# Patient Record
Sex: Female | Born: 1965 | Race: White | Hispanic: No | Marital: Married | Smoking: Never smoker
Health system: Southern US, Community
[De-identification: ages and names within clinical notes are randomized; demographics above are authoritative.]

## PROBLEM LIST (undated history)

## (undated) DIAGNOSIS — R946 Abnormal results of thyroid function studies: Secondary | ICD-10-CM

## (undated) DIAGNOSIS — G96191 Perineural cyst: Secondary | ICD-10-CM

## (undated) DIAGNOSIS — K219 Gastro-esophageal reflux disease without esophagitis: Secondary | ICD-10-CM

## (undated) DIAGNOSIS — G548 Other nerve root and plexus disorders: Secondary | ICD-10-CM

## (undated) DIAGNOSIS — D259 Leiomyoma of uterus, unspecified: Secondary | ICD-10-CM

## (undated) DIAGNOSIS — G588 Other specified mononeuropathies: Secondary | ICD-10-CM

## (undated) DIAGNOSIS — N9489 Other specified conditions associated with female genital organs and menstrual cycle: Secondary | ICD-10-CM

## (undated) DIAGNOSIS — M719 Bursopathy, unspecified: Secondary | ICD-10-CM

## (undated) DIAGNOSIS — M67919 Unspecified disorder of synovium and tendon, unspecified shoulder: Secondary | ICD-10-CM

## (undated) DIAGNOSIS — R292 Abnormal reflex: Secondary | ICD-10-CM

## (undated) DIAGNOSIS — N189 Chronic kidney disease, unspecified: Secondary | ICD-10-CM

## (undated) DIAGNOSIS — G8929 Other chronic pain: Secondary | ICD-10-CM

## (undated) DIAGNOSIS — K2970 Gastritis, unspecified, without bleeding: Secondary | ICD-10-CM

## (undated) DIAGNOSIS — M542 Cervicalgia: Secondary | ICD-10-CM

## (undated) DIAGNOSIS — R102 Pelvic and perineal pain unspecified side: Secondary | ICD-10-CM

## (undated) DIAGNOSIS — L570 Actinic keratosis: Secondary | ICD-10-CM

## (undated) DIAGNOSIS — D219 Benign neoplasm of connective and other soft tissue, unspecified: Secondary | ICD-10-CM

## (undated) HISTORY — DX: Gastritis, unspecified, without bleeding: K29.70

## (undated) HISTORY — DX: Other specified conditions associated with female genital organs and menstrual cycle: N94.89

## (undated) HISTORY — DX: Actinic keratosis: L57.0

## (undated) HISTORY — DX: Pelvic and perineal pain unspecified side: R10.20

## (undated) HISTORY — DX: Chronic kidney disease, unspecified: N18.9

## (undated) HISTORY — DX: Perineural cyst: G96.191

## (undated) HISTORY — DX: Benign neoplasm of connective and other soft tissue, unspecified: D21.9

## (undated) HISTORY — DX: Abnormal reflex: R29.2

## (undated) HISTORY — DX: Other specified mononeuropathies: G58.8

## (undated) HISTORY — DX: Pelvic and perineal pain: R10.2

## (undated) HISTORY — DX: Other chronic pain: G89.29

## (undated) HISTORY — DX: Abnormal results of thyroid function studies: R94.6

## (undated) HISTORY — DX: Cervicalgia: M54.2

## (undated) HISTORY — PX: APPENDECTOMY: SHX54

## (undated) HISTORY — DX: Gastro-esophageal reflux disease without esophagitis: K21.9

## (undated) HISTORY — DX: Other nerve root and plexus disorders: G54.8

---
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
## 2001-02-24 HISTORY — PX: NISSEN FUNDOPLICATION: SHX2091

## 2008-04-24 ENCOUNTER — Ambulatory Visit: Payer: Self-pay | Admitting: Sports Medicine

## 2009-01-03 DIAGNOSIS — D229 Melanocytic nevi, unspecified: Secondary | ICD-10-CM

## 2009-01-03 HISTORY — DX: Melanocytic nevi, unspecified: D22.9

## 2010-02-24 HISTORY — PX: TOTAL ABDOMINAL HYSTERECTOMY: SHX209

## 2010-02-24 HISTORY — PX: ABDOMINAL HYSTERECTOMY: SHX81

## 2010-05-08 ENCOUNTER — Ambulatory Visit: Payer: Self-pay | Admitting: Family Medicine

## 2010-08-06 ENCOUNTER — Ambulatory Visit: Payer: Self-pay | Admitting: Obstetrics and Gynecology

## 2010-08-12 ENCOUNTER — Ambulatory Visit: Payer: Self-pay | Admitting: Obstetrics and Gynecology

## 2013-01-19 ENCOUNTER — Ambulatory Visit: Payer: Self-pay | Admitting: Family Medicine

## 2013-02-15 ENCOUNTER — Ambulatory Visit: Payer: Self-pay | Admitting: Family Medicine

## 2013-06-08 ENCOUNTER — Ambulatory Visit: Payer: Self-pay | Admitting: Family Medicine

## 2013-08-23 ENCOUNTER — Ambulatory Visit: Payer: Self-pay | Admitting: Family Medicine

## 2013-09-29 ENCOUNTER — Ambulatory Visit: Payer: Self-pay | Admitting: Family Medicine

## 2013-10-14 ENCOUNTER — Ambulatory Visit: Payer: Self-pay | Admitting: Neurology

## 2014-06-12 ENCOUNTER — Ambulatory Visit: Payer: Self-pay | Admitting: Podiatry

## 2014-06-14 DIAGNOSIS — G588 Other specified mononeuropathies: Secondary | ICD-10-CM | POA: Insufficient documentation

## 2014-06-14 DIAGNOSIS — N9489 Other specified conditions associated with female genital organs and menstrual cycle: Secondary | ICD-10-CM | POA: Insufficient documentation

## 2014-06-14 DIAGNOSIS — K219 Gastro-esophageal reflux disease without esophagitis: Secondary | ICD-10-CM | POA: Insufficient documentation

## 2014-06-14 DIAGNOSIS — N189 Chronic kidney disease, unspecified: Secondary | ICD-10-CM | POA: Insufficient documentation

## 2014-07-05 ENCOUNTER — Ambulatory Visit: Payer: Self-pay | Admitting: Podiatry

## 2014-07-12 ENCOUNTER — Ambulatory Visit: Payer: Self-pay | Admitting: Podiatry

## 2014-07-19 ENCOUNTER — Other Ambulatory Visit: Payer: Self-pay | Admitting: Family Medicine

## 2014-07-19 ENCOUNTER — Ambulatory Visit
Admission: RE | Admit: 2014-07-19 | Discharge: 2014-07-19 | Disposition: A | Discharge status: Home / Self Care | Payer: BC Managed Care – PPO | Source: Ambulatory Visit | Attending: Family Medicine | Admitting: Family Medicine

## 2014-07-19 DIAGNOSIS — M79644 Pain in right finger(s): Secondary | ICD-10-CM

## 2014-07-19 DIAGNOSIS — S62666A Nondisplaced fracture of distal phalanx of right little finger, initial encounter for closed fracture: Secondary | ICD-10-CM | POA: Diagnosis not present

## 2014-07-19 DIAGNOSIS — W19XXXA Unspecified fall, initial encounter: Secondary | ICD-10-CM | POA: Insufficient documentation

## 2014-08-04 ENCOUNTER — Ambulatory Visit: Payer: Self-pay | Admitting: Family Medicine

## 2014-08-07 ENCOUNTER — Ambulatory Visit (INDEPENDENT_AMBULATORY_CARE_PROVIDER_SITE_OTHER): Payer: BC Managed Care – PPO | Admitting: Family Medicine

## 2014-08-07 ENCOUNTER — Encounter: Payer: Self-pay | Admitting: Family Medicine

## 2014-08-07 VITALS — BP 111/76 | HR 63 | Temp 97.6°F | Ht 63.3 in | Wt 136.4 lb

## 2014-08-07 DIAGNOSIS — M999 Biomechanical lesion, unspecified: Secondary | ICD-10-CM

## 2014-08-07 DIAGNOSIS — M9902 Segmental and somatic dysfunction of thoracic region: Secondary | ICD-10-CM | POA: Diagnosis not present

## 2014-08-07 DIAGNOSIS — M9903 Segmental and somatic dysfunction of lumbar region: Secondary | ICD-10-CM | POA: Diagnosis not present

## 2014-08-07 DIAGNOSIS — M9905 Segmental and somatic dysfunction of pelvic region: Secondary | ICD-10-CM | POA: Diagnosis not present

## 2014-08-07 DIAGNOSIS — M9904 Segmental and somatic dysfunction of sacral region: Secondary | ICD-10-CM | POA: Diagnosis not present

## 2014-08-07 DIAGNOSIS — M9909 Segmental and somatic dysfunction of abdomen and other regions: Secondary | ICD-10-CM | POA: Diagnosis not present

## 2014-08-07 DIAGNOSIS — N9489 Other specified conditions associated with female genital organs and menstrual cycle: Secondary | ICD-10-CM

## 2014-08-07 DIAGNOSIS — M489 Spondylopathy, unspecified: Secondary | ICD-10-CM

## 2014-08-07 DIAGNOSIS — M9906 Segmental and somatic dysfunction of lower extremity: Secondary | ICD-10-CM | POA: Diagnosis not present

## 2014-08-07 NOTE — Assessment & Plan Note (Signed)
Stable, seems to be in a bit of an exacerbation at this time. Her pain has been chronic with the patient for 18+ months, although better over the past 8 months. Did better with PT, but it was in [facility] which made it difficult to get there. Complains of neuropathic pain, could not tolerate neuroleptics. 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 Today, her restrictions seem to be doing a bit better on the L, but her psoas is hypertonic on the R causing increased issues. She did well with her last OMT treatment and had not had a flare in 2 months. She is interested in seeing how OMT helps her flare today. She is interested in continuing OMT to see if it might help. She does seem to have some functional and myofascial issues that may benefit from OMT. Patient treated today, as discussed below. Will continue to monitor and she will return in 1-2 weeks for recheck.

## 2014-08-07 NOTE — Patient Instructions (Signed)
Back Injury Prevention The following tips can help you to prevent a back injury. PHYSICAL FITNESS  Exercise often. Try to develop strong stomach (abdominal) muscles.  Do aerobic exercises often. This includes walking, jogging, biking, swimming.  Do exercises that help with balance and strength often. This includes tai chi and yoga.  Stretch before and after you exercise.  Keep a healthy weight. DIET   Ask your doctor how much calcium and vitamin D you need every day.  Include calcium in your diet. Foods high in calcium include dairy products; green, leafy vegetables; and products with calcium added (fortified).  Include vitamin D in your diet. Foods high in vitamin D include milk and products with vitamin D added.  Think about taking a multivitamin or other nutritional products called " supplements."  Stop smoking if you smoke. POSTURE   Sit and stand up straight. Avoid leaning forward or hunching over.  Choose chairs that support your lower back.  If you work at a desk:  Sit close to your work so you do not lean over.  Keep your chin tucked in.  Keep your neck drawn back.  Keep your elbows bent at a right angle. Your arms should look like the letter "L."  Sit high and close to the steering wheel when you drive. Add low back support to your car seat if needed.  Avoid sitting or standing in one position for too long. Get up and move around every hour. Take breaks if you are driving for a long time.  Sleep on your side with your knees slightly bent. You can also sleep on your back with a pillow under your knees. Do not sleep on your stomach. LIFTING, TWISTING, AND REACHING  Avoid heavy lifting, especially lifting over and over again. If you must do heavy lifting:  Stretch before lifting.  Work slowly.  Rest between lifts.  Use carts and dollies to move objects when possible.  Make several small trips instead of carrying 1 heavy load.  Ask for help when you  need it.  Ask for help when moving big, awkward objects.  Follow these steps when lifting:  Stand with your feet shoulder-width apart.  Get as close to the object as you can. Do not pick up heavy objects that are far from your body.  Use handles or lifting straps when possible.  Bend at your knees. Squat down, but keep your heels off the floor.  Keep your shoulders back, your chin tucked in, and your back straight.  Lift the object slowly. Tighten the muscles in your legs, stomach, and butt. Keep the object as close to the center of your body as possible.  Reverse these directions when you put a load down.  Do not:  Lift the object above your waist.  Twist at the waist while lifting or carrying a load. Move your feet if you need to turn, not your waist.  Bend over without bending at your knees.  Avoid reaching over your head, across a table, or for an object on a high surface. OTHER TIPS  Avoid wet floors and keep sidewalks clear of ice.  Do not sleep on a mattress that is too soft or too hard.  Keep items that you use often within easy reach.  Put heavier objects on shelves at waist level. Put lighter objects on lower or higher shelves.  Find ways to lessen your stress. You can try exercise, massage, or relaxation.  Get help for depression or anxiety if  needed. GET HELP IF:  You injure your back.  You have questions about diet, exercise, or other ways to prevent back injuries. MAKE SURE YOU:  Understand these instructions.  Will watch your condition.  Will get help right away if you are not doing well or get worse. Document Released: 07/30/2007 Document Revised: 05/05/2011 Document Reviewed: 03/24/2011 [facility] Patient Information 2015 [place], [place]. This information is not intended to replace advice given to you by your health care provider. Make sure you discuss any questions you have with your health care provider.

## 2014-08-07 NOTE — Progress Notes (Signed)
BP 111/76 mmHg  Pulse 63  Temp(Src) 97.6 F (36.4 C)  Ht 5' 3.3" (1.608 m)  Wt 136 lb 6.4 oz (61.871 kg)  BMI 23.93 kg/m2  SpO2 99%  LMP 07/26/2010 (Approximate)   Subjective:    Patient ID: Sabrina Baxter, female    DOB: [birth year], 48 y.o.   MRN: [id number]  HPI: Sabrina Baxter is a 49 y.o. female  Chief Complaint  Patient presents with  . Back Pain   BACK PAIN- Sabrina Baxter notes that she is in a bit of a flare with her pelvic pain today and has been feeling not like sitting. She has not needed to take her flexeril as she doesn't feel like she has needed it and it has gotten that bad. She feels like OMT has helped and is interested to see what happens today when she is in a flare. She is otherwise feeling well with no other concerns or complaints at this time.  Duration: months Mechanism of injury: unknown Location: low back Onset: sudden Severity: mild Quality: sharp, aching, burning and pulling Frequency: occasional Radiation: none Aggravating factors: movement and prolonged sitting Alleviating factors: rest, heat and muscle relaxer Status: fluctuating Treatments attempted: rest, ice, heat, APAP, ibuprofen, aleve, physical therapy, HEP and OMM  Relief with NSAIDs?: mild Nighttime pain:  no Paresthesias / decreased sensation:  no Bowel / bladder incontinence:  no Fevers:  no Dysuria / urinary frequency:  no  Relevant past medical, surgical, family and social history reviewed and updated as indicated. Interim medical history since our last visit reviewed. Allergies and medications reviewed and updated.  Review of Systems  Constitutional: Negative.   Respiratory: Negative.   Cardiovascular: Negative.   Musculoskeletal: Negative.   Psychiatric/Behavioral: Negative.     Per HPI unless specifically indicated above     Objective:    BP 111/76 mmHg  Pulse 63  Temp(Src) 97.6 F (36.4 C)  Ht 5' 3.3" (1.608 m)  Wt 136 lb 6.4 oz (61.871 kg)  BMI 23.93 kg/m2  SpO2 99%   LMP 07/26/2010 (Approximate)  Wt Readings from Last 3 Encounters:  08/07/14 136 lb 6.4 oz (61.871 kg)  06/12/14 138 lb (62.596 kg)    Physical Exam  Constitutional: She is oriented to person, place, and time. She appears well-developed and well-nourished. No distress.  HENT:  Head: Normocephalic and atraumatic.  Neck: No tracheal deviation present. No thyromegaly present.  Pulmonary/Chest: Effort normal and breath sounds normal.  Abdominal: Soft. Bowel sounds are normal. She exhibits no distension and no mass. There is no tenderness. There is no rebound and no guarding.  Lymphadenopathy:    She has no cervical adenopathy.  Neurological: She is alert and oriented to person, place, and time.  Skin: Skin is warm and dry. She is not diaphoretic.  Psychiatric: She has a normal mood and affect. Her behavior is normal. Judgment and thought content normal.  Nursing note and vitals reviewed. Musculoskeletal:  Exam found Decreased ROM, Tissue texture changes and Tenderness to palpation of patient's  thorax, lumbar, pelvis, sacrum, lower extremity and abdomen Osteopathic Structural Exam:   Thorax: T10-12SRRL, periscapular hypertonicity on the R  Lumbar: psoas hypertonic bilaterally R>L, L3-5SRRL  Pelvis: Anterior L innominate, SI joint restricted bilaterally R>L  Sacrum: R on R torsion, SI joint restricted on the R  Lower Extremity: glut hypertonicity on the R, IT band hypertonicity on the L, internally rotated L femur  Abdomen:  Pelvic diaphragm restricted throughout, L>R with fascial pull into L hip, diaphragm restricted  bilaterally L>R      Assessment & Plan:   Problem List Items Addressed This Visit      Genitourinary   Pelvic pain syndrome - Primary     Stable, seems to be in a bit of an exacerbation at this time. Her pain has been chronic with the patient for 18+ months, although better over the past 8 months. Did better with PT, but it was in [facility] which made it difficult to  get there. Complains of neuropathic pain, could not tolerate neuroleptics. 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 Today, her restrictions seem to be doing a bit better on the L, but her psoas is hypertonic on the R causing increased issues. She did well with her last OMT treatment and had not had a flare in 2 months. She is interested in seeing how OMT helps her flare today. She is interested in continuing OMT to see if it might help. She does seem to have some functional and myofascial issues that may benefit from OMT. Patient treated today, as discussed below. Will continue to monitor and she will return in 1-2 weeks for recheck.        Other Visit Diagnoses    Thoracic segment dysfunction        see below    Lumbar dysfunction        see below    Segmental and somatic dysfunction of sacral region        see below    Segmental dysfunction of pelvic region        see below    Nonallopathic lesion of lower extremities        see below    Segmental dysfunction of abdomen        see below      After verbal consent was obtained, patient was treated today with osteopathic manipulative medicine to the regions of the thorax, lumbar, pelvis, sacrum, abdomen and lower extremity using the techniques of Still, FPR, myofascial release, counterstrain, muscle energy and soft tissue. Areas of compensation relating to her primary pain source also treated. Patient tolerated the procedure well with good objective and good subjective improvement in symptoms. .sex left the room in good condition. He was advised to stay well hydrated and that he may have some soreness following the procedure. If not improving or worsening, he will call and come in. Home exercise program of stretches for psoas discussed and demonstrated today. Patient will do these stretches BID to before the point of pain, and will return for reevaluation  in 1-2 weeks if needed.   Follow up plan: Return As scheduled for  OMM.

## 2014-08-08 ENCOUNTER — Telehealth: Payer: Self-pay

## 2014-08-08 MED ORDER — DICLOFENAC SODIUM 1 % TD GEL: 2.0000 g | Freq: Four times a day (QID) | TRANSDERMAL | Status: DC

## 2014-08-08 NOTE — Telephone Encounter (Signed)
Rx sent to her pharmacy 

## 2014-08-08 NOTE — Telephone Encounter (Signed)
Patient states that when she was in the office yesterday you discussed giving her a cream for her broke finger. She forgot to get that prescription.

## 2014-08-09 ENCOUNTER — Ambulatory Visit (INDEPENDENT_AMBULATORY_CARE_PROVIDER_SITE_OTHER): Payer: BC Managed Care – PPO

## 2014-08-09 ENCOUNTER — Ambulatory Visit (INDEPENDENT_AMBULATORY_CARE_PROVIDER_SITE_OTHER): Payer: BC Managed Care – PPO | Admitting: Podiatry

## 2014-08-09 VITALS — BP 116/77 | HR 77 | Resp 16

## 2014-08-09 DIAGNOSIS — M898X7 Other specified disorders of bone, ankle and foot: Secondary | ICD-10-CM

## 2014-08-09 DIAGNOSIS — M2570 Osteophyte, unspecified joint: Secondary | ICD-10-CM

## 2014-08-09 DIAGNOSIS — M2040 Other hammer toe(s) (acquired), unspecified foot: Secondary | ICD-10-CM

## 2014-08-09 NOTE — Progress Notes (Signed)
   Subjective:    Patient ID: Sabrina Baxter, female    DOB: [birth year], 49 y.o.   MRN: [id number]  HPI My pinky toes hurt me , they have been hurting for years my right one is worse than the left . i walk on the sides of my toes    Review of Systems     Objective:   Physical Exam: I reviewed her past mental history medications allergy surgery social history and review of systems area pulses are strongly palpable bilateral. Neurologic sensorium is intact. Deep tendon reflexes are intact. Mobile left musculoskeletal findings. 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 This has resulted in reactive hyperkeratosis to the lateral nail fold consistent with a Lister score is also associated with a lateral exostosis to the base of the fifth distal phalanx.      Plan: Discussed the etiology pathology conservative versus surgical therapy so we'll follow-up with her in the near future.    Assessment & Plan:  Assessment is adductovarus varus rotated hammertoe deformities bilateral.

## 2014-08-17 ENCOUNTER — Ambulatory Visit (INDEPENDENT_AMBULATORY_CARE_PROVIDER_SITE_OTHER): Payer: BC Managed Care – PPO | Admitting: Family Medicine

## 2014-08-17 ENCOUNTER — Encounter: Payer: Self-pay | Admitting: Family Medicine

## 2014-08-17 VITALS — BP 143/90 | HR 79 | Temp 98.2°F | Ht 63.8 in | Wt 138.8 lb

## 2014-08-17 DIAGNOSIS — N9489 Other specified conditions associated with female genital organs and menstrual cycle: Secondary | ICD-10-CM | POA: Diagnosis not present

## 2014-08-17 DIAGNOSIS — M9904 Segmental and somatic dysfunction of sacral region: Secondary | ICD-10-CM

## 2014-08-17 DIAGNOSIS — M9906 Segmental and somatic dysfunction of lower extremity: Secondary | ICD-10-CM | POA: Diagnosis not present

## 2014-08-17 DIAGNOSIS — M9905 Segmental and somatic dysfunction of pelvic region: Secondary | ICD-10-CM

## 2014-08-17 DIAGNOSIS — M9903 Segmental and somatic dysfunction of lumbar region: Secondary | ICD-10-CM

## 2014-08-17 DIAGNOSIS — M9909 Segmental and somatic dysfunction of abdomen and other regions: Secondary | ICD-10-CM | POA: Diagnosis not present

## 2014-08-17 DIAGNOSIS — M489 Spondylopathy, unspecified: Secondary | ICD-10-CM

## 2014-08-17 DIAGNOSIS — M999 Biomechanical lesion, unspecified: Secondary | ICD-10-CM

## 2014-08-17 NOTE — Assessment & Plan Note (Signed)
In acute exacerbation at this time. Her pain has been chronic for the past 18+ months, although better over the past 8 months. Today is in a flare for an unknown reason. Did better with PT- but getting to [facility] was hard. We will refer to pelvic rehab through [facility] which is closer if still not doing better after her vacation. She complains of neuropathic pain but she cannot tolerate neuroleptics. 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 May benefit from sclerotherapy for scar tissue from GYN procedures. It is not clear if OMT has been helping her and if this is a good mechanism for her. Will treat her today as she is in a flare and see if it is benefitial, but will also look for other treatment modalities to help with her symptoms. Follow up in 2 weeks. She does have some functional and myofascial issues that may benefit from OMT. She was treated today as discussed below.

## 2014-08-17 NOTE — Patient Instructions (Signed)
Back Exercises These exercises may help you when beginning to rehabilitate your injury. Your symptoms may resolve with or without further involvement from your physician, physical therapist or athletic trainer. While completing these exercises, remember:   Restoring tissue flexibility helps normal motion to return to the joints. This allows healthier, less painful movement and activity.  An effective stretch should be held for at least 30 seconds.  A stretch should never be painful. You should only feel a gentle lengthening or release in the stretched tissue. STRETCH - Extension, Prone on Elbows   Lie on your stomach on the floor, a bed will be too soft. Place your palms about shoulder width apart and at the height of your head.  Place your elbows under your shoulders. If this is too painful, stack pillows under your chest.  Allow your body to relax so that your hips drop lower and make contact more completely with the floor.  Hold this position for __________ seconds.  Slowly return to lying flat on the floor. Repeat __________ times. Complete this exercise __________ times per day.  RANGE OF MOTION - Extension, Prone Press Ups   Lie on your stomach on the floor, a bed will be too soft. Place your palms about shoulder width apart and at the height of your head.  Keeping your back as relaxed as possible, slowly straighten your elbows while keeping your hips on the floor. You may adjust the placement of your hands to maximize your comfort. As you gain motion, your hands will come more underneath your shoulders.  Hold this position __________ seconds.  Slowly return to lying flat on the floor. Repeat __________ times. Complete this exercise __________ times per day.  RANGE OF MOTION- Quadruped, Neutral Spine   Assume a hands and knees position on a firm surface. Keep your hands under your shoulders and your knees under your hips. You may place padding under your knees for  comfort.  Drop your head and point your tail bone toward the ground below you. This will round out your low back like an angry cat. Hold this position for __________ seconds.  Slowly lift your head and release your tail bone so that your back sags into a large arch, like an old horse.  Hold this position for __________ seconds.  Repeat this until you feel limber in your low back.  Now, find your "sweet spot." This will be the most comfortable position somewhere between the two previous positions. This is your neutral spine. Once you have found this position, tense your stomach muscles to support your low back.  Hold this position for __________ seconds. Repeat __________ times. Complete this exercise __________ times per day.  STRETCH - Flexion, Single Knee to Chest   Lie on a firm bed or floor with both legs extended in front of you.  Keeping one leg in contact with the floor, bring your opposite knee to your chest. Hold your leg in place by either grabbing behind your thigh or at your knee.  Pull until you feel a gentle stretch in your low back. Hold __________ seconds.  Slowly release your grasp and repeat the exercise with the opposite side. Repeat __________ times. Complete this exercise __________ times per day.  STRETCH - Hamstrings, Standing  Stand or sit and extend your right / left leg, placing your foot on a chair or foot stool  Keeping a slight arch in your low back and your hips straight forward.  Lead with your chest and   lean forward at the waist until you feel a gentle stretch in the back of your right / left knee or thigh. (When done correctly, this exercise requires leaning only a small distance.)  Hold this position for __________ seconds. Repeat __________ times. Complete this stretch __________ times per day. STRENGTHENING - Deep Abdominals, Pelvic Tilt   Lie on a firm bed or floor. Keeping your legs in front of you, bend your knees so they are both pointed  toward the ceiling and your feet are flat on the floor.  Tense your lower abdominal muscles to press your low back into the floor. This motion will rotate your pelvis so that your tail bone is scooping upwards rather than pointing at your feet or into the floor.  With a gentle tension and even breathing, hold this position for __________ seconds. Repeat __________ times. Complete this exercise __________ times per day.  STRENGTHENING - Abdominals, Crunches   Lie on a firm bed or floor. Keeping your legs in front of you, bend your knees so they are both pointed toward the ceiling and your feet are flat on the floor. Cross your arms over your chest.  Slightly tip your chin down without bending your neck.  Tense your abdominals and slowly lift your trunk high enough to just clear your shoulder blades. Lifting higher can put excessive stress on the low back and does not further strengthen your abdominal muscles.  Control your return to the starting position. Repeat __________ times. Complete this exercise __________ times per day.  STRENGTHENING - Quadruped, Opposite UE/LE Lift   Assume a hands and knees position on a firm surface. Keep your hands under your shoulders and your knees under your hips. You may place padding under your knees for comfort.  Find your neutral spine and gently tense your abdominal muscles so that you can maintain this position. Your shoulders and hips should form a rectangle that is parallel with the floor and is not twisted.  Keeping your trunk steady, lift your right hand no higher than your shoulder and then your left leg no higher than your hip. Make sure you are not holding your breath. Hold this position __________ seconds.  Continuing to keep your abdominal muscles tense and your back steady, slowly return to your starting position. Repeat with the opposite arm and leg. Repeat __________ times. Complete this exercise __________ times per day. Document Released:  02/28/2005 Document Revised: 05/05/2011 Document Reviewed: 05/25/2008 [employer] Patient Information 2015 [employer]. This information is not intended to replace advice given to you by your health care provider. Make sure you discuss any questions you have with your health care provider.  

## 2014-08-17 NOTE — Progress Notes (Signed)
BP 143/90 mmHg  Pulse 79  Temp(Src) 98.2 F (36.8 C)  Ht 5' 3.8" (1.621 m)  Wt 138 lb 12.8 oz (62.959 kg)  BMI 23.96 kg/m2  SpO2 100%  LMP 07/26/2010 (Approximate)   Subjective:    Patient ID: Sabrina Baxter, female    DOB: [birth year], 49 y.o.   MRN: [id number]  HPI: Sabrina Baxter is a 49 y.o. female who presents today for evaluation and possible treatment for chronic intermittent pelvic pain. She is not doing well today. She is in a flare and has had trouble sitting down. Trying to avoid taking her muscle relaxer. She is still not sure if OMT helped. She was in a flare last time she was here and is not sure if it helped or not. She is otherwise feeling well with no other concerns or complaints at this time. + headache today and notes that her BP is up. Has never noticed that it is before.   Chief Complaint  Patient presents with  . Pelvic Pain   BACK PAIN Duration: chronic Mechanism of injury: unknown Location: low back Onset: sudden Severity: moderate Quality: sharp, dull, aching, burning, cramping, ill-defined, itchy, pressure-like, pulling, shooting, sore, stabbing, tender, tearing and throbbing Frequency: intermittent Radiation: none Aggravating factors: prolonged sitting Alleviating factors: muscle relaxer Status: worse Treatments attempted: rest, ice, heat, APAP, ibuprofen, aleve, physical therapy, HEP and OMM  Relief with NSAIDs?: mild Nighttime pain:  no Paresthesias / decreased sensation:  yes Bowel / bladder incontinence:  no Fevers:  no Dysuria / urinary frequency:  no   Relevant past medical, surgical, family and social history reviewed and updated as indicated. Interim medical history since our last visit reviewed. Allergies and medications reviewed and updated.  Review of Systems  Constitutional: Negative.   Respiratory: Negative.   Cardiovascular: Negative.   Gastrointestinal: Negative.   Musculoskeletal: Negative.   Psychiatric/Behavioral: Negative.      Per HPI unless specifically indicated above     Objective:    BP 143/90 mmHg  Pulse 79  Temp(Src) 98.2 F (36.8 C)  Ht 5' 3.8" (1.621 m)  Wt 138 lb 12.8 oz (62.959 kg)  BMI 23.96 kg/m2  SpO2 100%  LMP 07/26/2010 (Approximate) Repeat BP 140/90 Wt Readings from Last 3 Encounters:  08/17/14 138 lb 12.8 oz (62.959 kg)  08/07/14 136 lb 6.4 oz (61.871 kg)  06/12/14 138 lb (62.596 kg)    Physical Exam  Constitutional: She is oriented to person, place, and time. She appears well-developed and well-nourished. No distress.  HENT:  Head: Normocephalic and atraumatic.  Right Ear: Hearing normal.  Left Ear: Hearing normal.  Nose: Nose normal.  Eyes: Conjunctivae and lids are normal. Right eye exhibits no discharge. Left eye exhibits no discharge. No scleral icterus.  Pulmonary/Chest: Effort normal. No respiratory distress.  Abdominal: Soft. She exhibits no distension and no mass. There is no tenderness. There is no rebound and no guarding.  Neurological: She is alert and oriented to person, place, and time.  Skin: Skin is warm, dry and intact. No rash noted. No erythema. No pallor.  Psychiatric: She has a normal mood and affect. Her speech is normal and behavior is normal. Judgment and thought content normal. Cognition and memory are normal.  Musculoskeletal:  Exam found Decreased ROM, Tissue texture changes and Tenderness to palpation of patient's  lumbar, pelvis, sacrum, lower extremity and abdomen Osteopathic Structural Exam:   Lumbar: [id number], psoas hypertonic bilaterally L>>R with tenderpoint in it  Pelvis: Anterior tilt to pelvis with  Anterior rotated R innominate, SI joint very restricted on the R, pubic bone compressed  Sacrum: R on R torsion with SI joint restricted on the R with a lot of congestion and tightness in the perisacral ligments on the R  Lower Extremity: fascial strain through the R leg into the navicular coming from R SI joint with R leg pulled up and compressed    Abdomen: pelvic diaphragm restricted and pulling to the R, seems to be some scar tissue related to her hysterectomy and previous GYN surgeries.       Assessment & Plan:   Problem List Items Addressed This Visit      Genitourinary   Pelvic pain syndrome - Primary    In acute exacerbation at this time. Her pain has been chronic for the past 18+ months, although better over the past 8 months. Today is in a flare for an unknown reason. Did better with PT- but getting to [facility] was hard. We will refer to pelvic rehab through [facility] which is closer if still not doing better after her vacation. She complains of neuropathic pain but she cannot tolerate neuroleptics. 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 May benefit from sclerotherapy for scar tissue from GYN procedures. It is not clear if OMT has been helping her and if this is a good mechanism for her. Will treat her today as she is in a flare and see if it is benefitial, but will also look for other treatment modalities to help with her symptoms. Follow up in 2 weeks. She does have some functional and myofascial issues that may benefit from OMT. She was treated today as discussed below.        Other Visit Diagnoses    Lumbar dysfunction        Segmental and somatic dysfunction of sacral region        Segmental dysfunction of pelvic region        Nonallopathic lesion of lower extremities        Segmental dysfunction of abdomen          After verbal consent was obtained, patient was treated today with osteopathic manipulative medicine to the regions of the lumbar, pelvis, sacrum, abdomen and lower extremity using the techniques of FPR, myofascial release, counterstrain, muscle energy, HVLA and soft tissue. Areas of compensation relating to her primary pain source also treated. Patient tolerated the procedure well with good objective and fair subjective improvement in symptoms. .sex left the room in good condition. He was advised to  stay well hydrated and that he may have some soreness following the procedure. If not improving or worsening, he will call and come in. Home exercise program of stretches for back discussed and demonstrated today. Patient will do these stretches BID to before the point of pain, and will return for reevaluation   in 2-3 weeks.   Follow up plan: Return in about 2 weeks (around 08/31/2014).

## 2014-10-02 ENCOUNTER — Encounter: Payer: Self-pay | Admitting: Family Medicine

## 2014-10-02 ENCOUNTER — Ambulatory Visit (INDEPENDENT_AMBULATORY_CARE_PROVIDER_SITE_OTHER): Payer: BC Managed Care – PPO | Admitting: Family Medicine

## 2014-10-02 VITALS — BP 103/76 | HR 76 | Temp 98.2°F | Wt 140.6 lb

## 2014-10-02 DIAGNOSIS — M9903 Segmental and somatic dysfunction of lumbar region: Secondary | ICD-10-CM | POA: Diagnosis not present

## 2014-10-02 DIAGNOSIS — M9904 Segmental and somatic dysfunction of sacral region: Secondary | ICD-10-CM

## 2014-10-02 DIAGNOSIS — M999 Biomechanical lesion, unspecified: Secondary | ICD-10-CM

## 2014-10-02 DIAGNOSIS — N9489 Other specified conditions associated with female genital organs and menstrual cycle: Secondary | ICD-10-CM | POA: Diagnosis not present

## 2014-10-02 DIAGNOSIS — M9905 Segmental and somatic dysfunction of pelvic region: Secondary | ICD-10-CM

## 2014-10-02 DIAGNOSIS — M9906 Segmental and somatic dysfunction of lower extremity: Secondary | ICD-10-CM

## 2014-10-02 DIAGNOSIS — M9909 Segmental and somatic dysfunction of abdomen and other regions: Secondary | ICD-10-CM

## 2014-10-02 DIAGNOSIS — M489 Spondylopathy, unspecified: Secondary | ICD-10-CM

## 2014-10-02 NOTE — Assessment & Plan Note (Signed)
In acute exacerbation at this time. Her pain has been chronic for the past 18+ months, although better over the past 10 months. Today is in a flare for an unknown reason. Did better with PT- but getting to [facility] was hard. We will refer to pelvic rehab through [facility] which is closer today. She complains of neuropathic pain but she cannot tolerate neuroleptics. 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 May benefit from sclerotherapy for scar tissue from GYN procedures. It is not clear if OMT has been helping her and if this is a good mechanism for her. Will treat her today as she is in a flare and see if it is benefitial, but will also look for other treatment modalities to help with her symptoms. Follow up in 2 weeks. She does have some functional and myofascial issues that may benefit from OMT. She was treated today as discussed below.

## 2014-10-02 NOTE — Progress Notes (Signed)
BP 103/76 mmHg  Pulse 76  Temp(Src) 98.2 F (36.8 C)  Wt 140 lb 9.6 oz (63.776 kg)  SpO2 99%  LMP 07/26/2010 (Approximate)   Subjective:    Patient ID: Sabrina Baxter, female    DOB: 08-19-1965, 49 y.o.   MRN: [id number]  HPI: Sabrina Baxter is a 49 y.o. female  Chief Complaint  Patient presents with  . Back Pain   Sabrina Baxter has just gotten back from her vacation and notes that she didn't do well for the first couple of weeks following her last treatment. She notes that she was driving following her last appointment and felt a "pop, pop, pop" in her low back followed by spreading pain from that area into her entire pelvis. She was very sore and had to take tramadol for several days to deal with it for the first part of her vacation, then she started using her myofascial release ball again and it was also very sore. It is just starting to get better and ease up, but she is noticing numbness in her R leg, which is new. She is willing to try PT again, but is anxious about seeing a new person as she liked her PT is [facility] so much. She notes that she is otherwise feeling well with no other concerns or complaints at this time.   Relevant past medical, surgical, family and social history reviewed and updated as indicated. Interim medical history since our last visit reviewed. Allergies and medications reviewed and updated.  Review of Systems  Constitutional: Negative.   Respiratory: Negative.   Cardiovascular: Negative.   Gastrointestinal: Negative.   Genitourinary: Negative.   Musculoskeletal: Negative.   Psychiatric/Behavioral: Negative.     Per HPI unless specifically indicated above     Objective:    BP 103/76 mmHg  Pulse 76  Temp(Src) 98.2 F (36.8 C)  Wt 140 lb 9.6 oz (63.776 kg)  SpO2 99%  LMP 07/26/2010 (Approximate)  Wt Readings from Last 3 Encounters:  10/02/14 140 lb 9.6 oz (63.776 kg)  08/17/14 138 lb 12.8 oz (62.959 kg)  08/07/14 136 lb 6.4 oz (61.871 kg)     Physical Exam  Constitutional: She is oriented to person, place, and time. She appears well-developed and well-nourished. No distress.  HENT:  Head: Normocephalic and atraumatic.  Right Ear: Hearing normal.  Left Ear: Hearing normal.  Nose: Nose normal.  Eyes: Conjunctivae and lids are normal. Right eye exhibits no discharge. Left eye exhibits no discharge. No scleral icterus.  Cardiovascular: Normal rate, regular rhythm and normal heart sounds.  Exam reveals no gallop and no friction rub.   No murmur heard. Pulmonary/Chest: Effort normal and breath sounds normal. No respiratory distress. She has no wheezes. She has no rales. She exhibits no tenderness.  Neurological: She is alert and oriented to person, place, and time.  Skin: Skin is warm, dry and intact. No rash noted. No erythema. No pallor.  Psychiatric: She has a normal mood and affect. Her speech is normal and behavior is normal. Judgment and thought content normal. Cognition and memory are normal.  Nursing note and vitals reviewed.  Musculoskeletal:  Exam found Decreased ROM, Tissue texture changes and Tenderness to palpation of patient's  lumbar, pelvis, sacrum, lower extremity and abdomen Osteopathic Structural Exam:   Lumbar: psoas hypertonic bilaterally R>L,   Pelvis: Posterior R innominate  Sacrum: L on L torsion, SI joint restricted on the R, perisacral ligaments hypertonic  Lower Extremity: IT band hypertonic on the R, glut  spasm on the R  Abdomen: Diaphragm restricted bilaterally, pelvic fascia restricted on the R, mesentery resticted      Assessment & Plan:   Problem List Items Addressed This Visit      Genitourinary   Pelvic pain syndrome - Primary    In acute exacerbation at this time. Her pain has been chronic for the past 18+ months, although better over the past 10 months. Today is in a flare for an unknown reason. Did better with PT- but getting to [facility] was hard. We will refer to pelvic rehab through  [facility] which is closer today. She complains of neuropathic pain but she cannot tolerate neuroleptics. 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 May benefit from sclerotherapy for scar tissue from GYN procedures. It is not clear if OMT has been helping her and if this is a good mechanism for her. Will treat her today as she is in a flare and see if it is benefitial, but will also look for other treatment modalities to help with her symptoms. Follow up in 2 weeks. She does have some functional and myofascial issues that may benefit from OMT. She was treated today as discussed below.          Other Visit Diagnoses    Lumbar dysfunction        Segmental and somatic dysfunction of sacral region        Segmental dysfunction of pelvic region        Nonallopathic lesion of lower extremities        Nonallopathic lesion of abdomen          After verbal consent was obtained, patient was treated today with osteopathic manipulative medicine to the regions of the lumbar, pelvis, sacrum, abdomen and lower extremity using the techniques of Still, FPR, myofascial release, counterstrain, muscle energy, HVLA and soft tissue. Areas of compensation relating to her primary pain source also treated. Patient tolerated the procedure well with good objective and fair subjective improvement in symptoms. She left the room in good condition. She was advised to stay well hydrated and that she may have some soreness following the procedure. If not improving or worsening, she will call and come in. She will return for reevaluation  in 1-2 weeks.   Follow up plan: Return in about 1 week (around 10/09/2014).

## 2014-10-11 ENCOUNTER — Ambulatory Visit (INDEPENDENT_AMBULATORY_CARE_PROVIDER_SITE_OTHER): Payer: BC Managed Care – PPO | Admitting: Family Medicine

## 2014-10-11 ENCOUNTER — Encounter: Payer: Self-pay | Admitting: Family Medicine

## 2014-10-11 VITALS — BP 108/75 | HR 57 | Temp 97.4°F | Wt 140.6 lb

## 2014-10-11 DIAGNOSIS — M9903 Segmental and somatic dysfunction of lumbar region: Secondary | ICD-10-CM

## 2014-10-11 DIAGNOSIS — M9905 Segmental and somatic dysfunction of pelvic region: Secondary | ICD-10-CM

## 2014-10-11 DIAGNOSIS — M9906 Segmental and somatic dysfunction of lower extremity: Secondary | ICD-10-CM | POA: Diagnosis not present

## 2014-10-11 DIAGNOSIS — M999 Biomechanical lesion, unspecified: Secondary | ICD-10-CM

## 2014-10-11 DIAGNOSIS — M9904 Segmental and somatic dysfunction of sacral region: Secondary | ICD-10-CM

## 2014-10-11 DIAGNOSIS — N9489 Other specified conditions associated with female genital organs and menstrual cycle: Secondary | ICD-10-CM | POA: Diagnosis not present

## 2014-10-11 DIAGNOSIS — M489 Spondylopathy, unspecified: Secondary | ICD-10-CM

## 2014-10-11 DIAGNOSIS — M9909 Segmental and somatic dysfunction of abdomen and other regions: Secondary | ICD-10-CM | POA: Diagnosis not present

## 2014-10-11 DIAGNOSIS — R102 Pelvic and perineal pain: Secondary | ICD-10-CM

## 2014-10-11 NOTE — Progress Notes (Signed)
BP 108/75 mmHg  Pulse 57  Temp(Src) 97.4 F (36.3 C)  Wt 140 lb 9.6 oz (63.776 kg)  SpO2 99%  LMP 07/26/2010 (Approximate)   Subjective:    Patient ID: Sabrina Baxter, female    DOB: 22-Jan-1966, 49 y.o.   MRN: [id number]  HPI: Sabrina Baxter is a 49 y.o. female  Chief Complaint  Patient presents with  . Back Pain    OMM   Sabrina Baxter is feeling much better! She notes that she did well following her last appointment and has been out of her flare since then. She is still stiff and not feeling like herself, but the pain is much better. She spoke to PT and is going to have her first appointment tomorrow. She feels like OMT was helpful and would like to continue with treatment. She felt like she is still better, but still tight. Pain continues in R hip and deep in her pelvis- but better. No other concerns or complaints at this time. Otherwise feeling well.   Relevant past medical, surgical, family and social history reviewed and updated as indicated. Interim medical history since our last visit reviewed. Allergies and medications reviewed and updated.  Review of Systems  Constitutional: Negative.   Respiratory: Negative.   Cardiovascular: Negative.   Gastrointestinal: Negative.   Musculoskeletal: Positive for myalgias. Negative for back pain, joint swelling, arthralgias, gait problem, neck pain and neck stiffness.  Psychiatric/Behavioral: Negative.     Per HPI unless specifically indicated above     Objective:    BP 108/75 mmHg  Pulse 57  Temp(Src) 97.4 F (36.3 C)  Wt 140 lb 9.6 oz (63.776 kg)  SpO2 99%  LMP 07/26/2010 (Approximate)  Wt Readings from Last 3 Encounters:  10/11/14 140 lb 9.6 oz (63.776 kg)  10/02/14 140 lb 9.6 oz (63.776 kg)  08/17/14 138 lb 12.8 oz (62.959 kg)    Physical Exam  Constitutional: She is oriented to person, place, and time. She appears well-developed and well-nourished. No distress.  HENT:  Head: Normocephalic and atraumatic.  Right Ear: Hearing  normal.  Left Ear: Hearing normal.  Nose: Nose normal.  Eyes: Conjunctivae and lids are normal. Right eye exhibits no discharge. Left eye exhibits no discharge. No scleral icterus.  Pulmonary/Chest: Effort normal. No respiratory distress.  Abdominal: Soft. She exhibits no distension and no mass. There is no tenderness. There is no rebound and no guarding.  Neurological: She is alert and oriented to person, place, and time.  Skin: Skin is warm, dry and intact. No rash noted. No erythema. No pallor.  Psychiatric: She has a normal mood and affect. Her speech is normal and behavior is normal. Judgment and thought content normal. Cognition and memory are normal.  Nursing note and vitals reviewed. Musculoskeletal:  Exam found Decreased ROM, Tissue texture changes, Tenderness to palpation and Asymmetry of patient's  lumbar, pelvis, sacrum, lower extremity and abdomen Osteopathic Structural Exam:   Lumbar: L4-5SLRR  Pelvis: Anterior R innominate  Sacrum: L on L torsion, SI joint restricted on the R  Lower Extremity: compressed R femur  Abdomen: pelvic diaphragm pulling to the R     Assessment & Plan:   Problem List Items Addressed This Visit      Genitourinary   Pelvic pain syndrome - Primary    Doing much better today, stable without exacerbation. Her pain has been chronic for the past 18+ months, although better over the past 10 months. Doing much better after last appointment.  Did better with PT-  but getting to [facility] was hard. We referred to pelvic rehab through [facility] which is closer ans she is seeing them tomorrow. She complains of neuropathic pain but she cannot tolerate neuroleptics. 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 May benefit from sclerotherapy for scar tissue from GYN procedures. That she is better from her flare after OMT is encouraging. Follow up in 2 weeks. She does have some functional and myofascial issues that may benefit from OMT. She was treated today as  discussed below.            Other Visit Diagnoses    Lumbar dysfunction        Segmental and somatic dysfunction of sacral region        Segmental dysfunction of pelvic region        Nonallopathic lesion of lower extremities        Nonallopathic lesion of abdomen         After verbal consent was obtained, patient was treated today with osteopathic manipulative medicine to the regions of the lumbar, pelvis, sacrum, abdomen and lower extremity using the techniques of myofascial release, counterstrain, muscle energy and soft tissue. Areas of compensation relating to her primary pain source also treated. Patient tolerated the procedure well with good objective and good subjective improvement in symptoms. She left the room in good condition. She was advised to stay well hydrated and that she may have some soreness following the procedure. If not improving or worsening, she will call and come in. She will return for reevaluation   in 2-3 weeks.   Follow up plan: Return in about 3 weeks (around 11/01/2014).

## 2014-10-11 NOTE — Assessment & Plan Note (Signed)
Doing much better today, stable without exacerbation. Her pain has been chronic for the past 18+ months, although better over the past 10 months. Doing much better after last appointment.  Did better with PT- but getting to [facility] was hard. We referred to pelvic rehab through [facility] which is closer ans she is seeing them tomorrow. She complains of neuropathic pain but she cannot tolerate neuroleptics. 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 May benefit from sclerotherapy for scar tissue from GYN procedures. That she is better from her flare after OMT is encouraging. Follow up in 2 weeks. She does have some functional and myofascial issues that may benefit from OMT. She was treated today as discussed below.

## 2014-10-12 ENCOUNTER — Ambulatory Visit: Payer: BC Managed Care – PPO | Attending: Family Medicine | Admitting: Physical Therapy

## 2014-10-12 DIAGNOSIS — R279 Unspecified lack of coordination: Secondary | ICD-10-CM | POA: Insufficient documentation

## 2014-10-12 DIAGNOSIS — M629 Disorder of muscle, unspecified: Secondary | ICD-10-CM | POA: Insufficient documentation

## 2014-10-12 NOTE — Patient Instructions (Signed)
Tuning into [employer] [place]: each night  Abdominal massage/ scar massage Body scan    Use folded towel in shape of "A" to tolerate laying on your back to offload sacrum  Read "I still Hurt" by Margarita Sermons, PT, PhD

## 2014-10-13 NOTE — Therapy (Deleted)
[place] MAIN [facility] SERVICES [address] [place], [place], [address] Phone: [phone]   Fax:  [phone]  Patient Details  Name: Sabrina Baxter MRN: [id number] Date of Birth: Mar 28, 1965 Referring Provider:  Valerie Roys, DO  Encounter Date: 10/12/2014   Jerl Mina 10/13/2014, 6:58 PM  [place] MAIN [facility] SERVICES [address] [place], [place], [address] Phone: [phone]   Fax:  [phone]

## 2014-10-16 ENCOUNTER — Encounter: Payer: BC Managed Care – PPO | Admitting: Physical Therapy
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
## 2014-10-16 NOTE — Addendum Note (Signed)
Addended by: Jerl Mina on: 10/16/2014 09:04 AM   Modules accepted: Orders

## 2014-10-18 ENCOUNTER — Ambulatory Visit: Payer: BC Managed Care – PPO | Admitting: Physical Therapy

## 2014-10-18 DIAGNOSIS — M629 Disorder of muscle, unspecified: Secondary | ICD-10-CM | POA: Diagnosis not present
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
## 2014-10-20 NOTE — Patient Instructions (Addendum)
Diaphragmatic breathing w/ sheet for tactile cueing in seated position Application of breathing and leaning trunk forward instead of pushing knee in piriformis stretch (in order to apply less effort) (provided by MD)  Bra strap placement lower w/ extensor to optimize diaphragmatic excursion Pelvic floor elevator imagery and ROM with breathing coordination Body Scan (emailed) 2x before bed Open Book 10 reps and childs pose rocking 5 reps    Emailed to pt to read Excerpt form The Breathing Book by Katina Dung on breathing mechanics, 3 diaphragms

## 2014-10-23 ENCOUNTER — Encounter: Payer: BC Managed Care – PPO | Admitting: Physical Therapy

## 2014-10-25 ENCOUNTER — Ambulatory Visit: Payer: BC Managed Care – PPO | Admitting: Physical Therapy

## 2014-10-25 DIAGNOSIS — M629 Disorder of muscle, unspecified: Secondary | ICD-10-CM

## 2014-10-25 DIAGNOSIS — R279 Unspecified lack of coordination: Secondary | ICD-10-CM
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
## 2014-10-26 NOTE — Patient Instructions (Signed)
Diaphragmatic excursion w/ pelvic floor ROM You are now ready to begin training the deep core muscles system: diaphragm, transverse abdominis, pelvic floor . These muscles must work together as a team.   Perform Level 1-2           The key to these exercises to train the brain to coordinate the timing of these muscles and to have them turn on for long periods of time to hold you upright against gravity (especially important if you are on your feet all day).These muscles are postural muscles and play a role stabilizing your spine and bodyweight. By doing these repetitions slowly and correctly instead of doing crunches, you will achieve a flatter belly without a lower pooch. You are also placing your spine in a more neutral position and breathing properly which in turn, decreases your risk for problems related to your pelvic floor, abdominal, and low back such as pelvic organ prolapse, hernias, diastasis recti (separation of superficial muscles), disk herniations, spinal fractures. These exercises set a solid foundation for you to later progress to resistance/ strength training with therabands and weights and return to other typical fitness exercises with a stronger deeper core.

## 2014-11-01 ENCOUNTER — Ambulatory Visit: Payer: BC Managed Care – PPO | Attending: Family Medicine | Admitting: Physical Therapy

## 2014-11-01 DIAGNOSIS — M629 Disorder of muscle, unspecified: Secondary | ICD-10-CM | POA: Diagnosis not present

## 2014-11-01 DIAGNOSIS — R279 Unspecified lack of coordination: Secondary | ICD-10-CM | POA: Diagnosis present

## 2014-11-02 NOTE — Patient Instructions (Addendum)
Thoracic ext stretches:  Low cobra, hands behind back  Chin tuck   ________________  Bed time routine options:  2hr off computer (use blue light blocker)  Eye pillow Weights over shoulders for compression Restorative pose with towel under shoulder blades Massage body after showers
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
## 2014-11-06 ENCOUNTER — Telehealth: Payer: Self-pay

## 2014-11-06 NOTE — Telephone Encounter (Signed)
Physical therapist called to give a positive report on patient. Patient was referred for Pelvic pain X5 years.She has had 4 treatments with no pain for two weeks.  Sabrina Baxter would like to thank you for the referral.

## 2014-11-09 ENCOUNTER — Ambulatory Visit: Payer: BC Managed Care – PPO | Admitting: Physical Therapy

## 2014-11-09 DIAGNOSIS — M629 Disorder of muscle, unspecified: Secondary | ICD-10-CM | POA: Diagnosis not present

## 2014-11-09 DIAGNOSIS — R279 Unspecified lack of coordination: Secondary | ICD-10-CM
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
## 2014-11-10 NOTE — Patient Instructions (Addendum)
Downward facing dog w/ strap on door knob and at anterior pelvis to increase fascial mobility  Body scan on couch w/ rolled towel and midback pillow for 5 min daily

## 2014-11-16 ENCOUNTER — Ambulatory Visit: Payer: BC Managed Care – PPO | Admitting: Physical Therapy

## 2014-11-16 DIAGNOSIS — M629 Disorder of muscle, unspecified: Secondary | ICD-10-CM

## 2014-11-16 DIAGNOSIS — R279 Unspecified lack of coordination: Secondary | ICD-10-CM

## 2014-11-16 NOTE — Patient Instructions (Signed)
Post-walking stretches:  Figure -4   Cross over twist     Double knee to chest      Pelvic tilts w/ hip knee at 90-90 deg with feet to wall     Sitting for > 15 min  Pelvic tilts posterior and anterior  5 x and them find pelvic neutral     Watch link on sacral nutation/ counternutation sent in email
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
## 2014-11-23 ENCOUNTER — Ambulatory Visit: Payer: BC Managed Care – PPO | Admitting: Physical Therapy

## 2014-11-23 DIAGNOSIS — M629 Disorder of muscle, unspecified: Secondary | ICD-10-CM | POA: Diagnosis not present

## 2014-11-23 DIAGNOSIS — R279 Unspecified lack of coordination: Secondary | ICD-10-CM
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
## 2014-11-23 NOTE — Patient Instructions (Addendum)
Prone Heel Press   5 sec, 10 reps      ____________________  Standing pelvic circles

## 2014-11-27 ENCOUNTER — Emergency Department
Admission: EM | Admit: 2014-11-27 | Discharge: 2014-11-28 | Disposition: A | Discharge status: Home / Self Care | Payer: BC Managed Care – PPO | Attending: Emergency Medicine | Admitting: Emergency Medicine

## 2014-11-27 ENCOUNTER — Emergency Department: Payer: BC Managed Care – PPO

## 2014-11-27 DIAGNOSIS — R1012 Left upper quadrant pain: Secondary | ICD-10-CM

## 2014-11-27 DIAGNOSIS — Z880 Allergy status to penicillin: Secondary | ICD-10-CM | POA: Insufficient documentation

## 2014-11-27 DIAGNOSIS — K2970 Gastritis, unspecified, without bleeding: Secondary | ICD-10-CM

## 2014-11-27 LAB — CBC
HCT: 39.5 % (ref 35.0–47.0)
Hemoglobin: 13.1 g/dL (ref 12.0–16.0)
MCH: 30.8 pg (ref 26.0–34.0)
MCHC: 33.2 g/dL (ref 32.0–36.0)
MCV: 92.7 fL (ref 80.0–100.0)
PLATELETS: 240 10*3/uL (ref 150–440)
RBC: 4.26 MIL/uL (ref 3.80–5.20)
RDW: 13.3 % (ref 11.5–14.5)
WBC: 10.9 10*3/uL (ref 3.6–11.0)

## 2014-11-27 LAB — COMPREHENSIVE METABOLIC PANEL
ALBUMIN: 4 g/dL (ref 3.5–5.0)
ALT: 14 U/L (ref 14–54)
AST: 20 U/L (ref 15–41)
Alkaline Phosphatase: 55 U/L (ref 38–126)
Anion gap: 11 (ref 5–15)
BUN: 29 mg/dL — AB (ref 6–20)
CHLORIDE: 101 mmol/L (ref 101–111)
CO2: 23 mmol/L (ref 22–32)
CREATININE: 1.29 mg/dL — AB (ref 0.44–1.00)
Calcium: 8.8 mg/dL — ABNORMAL LOW (ref 8.9–10.3)
GFR calc non Af Amer: 47 mL/min — ABNORMAL LOW (ref >60)
GFR, EST AFRICAN AMERICAN: 55 mL/min — AB (ref >60)
GLUCOSE: 87 mg/dL (ref 65–99)
Potassium: 3.5 mmol/L (ref 3.5–5.1)
SODIUM: 135 mmol/L (ref 135–145)
Total Bilirubin: 0.6 mg/dL (ref 0.3–1.2)
Total Protein: 7 g/dL (ref 6.5–8.1)

## 2014-11-27 LAB — URINALYSIS COMPLETE WITH MICROSCOPIC (ARMC ONLY)
Bilirubin Urine: NEGATIVE
GLUCOSE, UA: NEGATIVE mg/dL
HGB URINE DIPSTICK: NEGATIVE
Leukocytes, UA: NEGATIVE
Nitrite: NEGATIVE
PH: 5 (ref 5.0–8.0)
Protein, ur: NEGATIVE mg/dL
Specific Gravity, Urine: 1.021 (ref 1.005–1.030)

## 2014-11-27 LAB — LIPASE, BLOOD: LIPASE: 31 U/L (ref 22–51)

## 2014-11-27 MED ORDER — IOHEXOL 240 MG/ML SOLN
25.0000 mL | Freq: Once | INTRAMUSCULAR | Status: AC | PRN
  Administered 2014-11-27: 25 mL via ORAL

## 2014-11-27 MED ORDER — METOCLOPRAMIDE HCL 5 MG/ML IJ SOLN
5.0000 mg | Freq: Once | INTRAMUSCULAR | Status: AC
  Administered 2014-11-27: 5 mg via INTRAVENOUS

## 2014-11-27 MED ORDER — METOCLOPRAMIDE HCL 5 MG/ML IJ SOLN
INTRAMUSCULAR | Status: AC
  Administered 2014-11-27: 5 mg via INTRAVENOUS
  Filled 2014-11-27: qty 2

## 2014-11-27 MED ORDER — SODIUM CHLORIDE 0.9 % IV BOLUS (SEPSIS)
1000.0000 mL | Freq: Once | INTRAVENOUS | Status: AC
  Administered 2014-11-27: 1000 mL via INTRAVENOUS

## 2014-11-27 MED ORDER — IOHEXOL 300 MG/ML  SOLN
100.0000 mL | Freq: Once | INTRAMUSCULAR | Status: AC | PRN
  Administered 2014-11-27: 80 mL via INTRAVENOUS

## 2014-11-27 NOTE — ED Notes (Signed)
Pt c/o upper abd pain for the past 6 days, denies N/V/D.Marland Kitchendenies any thing that makes it better or worse.Marland Kitchen

## 2014-11-27 NOTE — ED Provider Notes (Signed)
-----------------------------------------   11:55 PM on 11/27/2014 -----------------------------------------   Blood pressure 126/65, pulse 65, temperature 98.3 F (36.8 C), temperature source Oral, resp. rate 18, height 5\' 3"  (1.6 m), weight 138 lb (62.596 kg), last menstrual period 07/26/2010, SpO2 100 %.  Assuming care from Dr. Thomasene Lot.  In short, Sabrina Baxter is a 49 y.o. female with a chief complaint of Abdominal Pain .  Refer to the original H&P for additional details.  The current plan of care is to follow up the results of the CT scan.  CT scan: Prominent gastric mural thickening, indeterminate, this could represent gastritis, Neoplasm less likely but not entirely excluded. Unremarkable appearances of the fundoplication. Small volume free pelvic fluid nonspecific   With the thickening of the patients stomach i will treat the patient for gastritis. I will discharge the patient to follow up with GI for further eval and biopsy. Otherwise, the patient will be discharged to home   Loney Hering, MD 11/28/14 0000

## 2014-11-27 NOTE — ED Provider Notes (Signed)
[facility] Emergency Department Provider Note  ____________________________________________  Time seen: 2225  I have reviewed the triage vital signs and the nursing notes.   HISTORY  Chief Complaint Abdominal Pain     HPI Sabrina Baxter is a 49 y.o. female who reports she is having abdominal pain. She has had similar abdominal pain episodes in the past, although the last episode was somewhere between one month and one year ago.  This current episode began on Wednesday. It has been steady and consistent since then. It seems to be focused in the epigastric and left upper abdominal area. It is not improved or worsened with food, although she reports she has low appetite and has not been eating much.  Despite having previous symptoms, the patient denies any prior evaluation, including ultrasound.  She denies any fever, shortness of breath,nausea, vomiting, or diarrhea.    Past Medical History  Diagnosis Date  . Fibroid     49yo  . GERD (gastroesophageal reflux disease)   . Chronic renal failure     worsened with NSAIDs  . Abnormal thyroid function test   . Hyperreflexia   . Pudendal neuralgia   . Pelvic pain syndrome   . Chronic neck pain   . 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 GERD (gastroesophageal reflux disease)   . Chronic renal failure   . Pudendal neuralgia   . Pelvic pain syndrome     Past Surgical History  Procedure Laterality Date  . Abdominal hysterectomy  2012    hemorrhaging  . Appendectomy    . Uterine fibroid surgery  1983  . Nissen fundoplication  4818    Current Outpatient Rx  Name  Route  Sig  Dispense  Refill  . Cholecalciferol (VITAMIN D3) 2000 UNITS capsule   Oral   Take by mouth.         . cyclobenzaprine (FLEXERIL) 5 MG tablet   Oral   Take 5 mg by mouth 3 (three) times daily as needed for muscle spasms.         Marland Kitchen ketotifen (ZADITOR) 0.025 %  ophthalmic solution      1 drop 2 (two) times daily.         . Magnesium 250 MG TABS   Oral   Take by mouth.         . Multiple Vitamin (MULTI-DAY) TABS   Oral   Take by mouth.         . traMADol-acetaminophen (ULTRACET) 37.5-325 MG per tablet   Oral   Take by mouth as needed.         . triamcinolone cream (KENALOG) 0.1 %      APPLY ON THE SKIN AS DIRECTED, DAILY UP TO TWICE A DAY TO HANDS UNTIL CLEAR, AVOID FACE, GROIN, AXIL      2     Allergies Amoxicillin; Diclofenac; and Mobic  Family History  Problem Relation Age of Onset  . Arthritis Mother     rheumatoid  . Depression Mother   . Hypertension Sister   . Thyroid disease Brother     Social History Social History  Substance Use Topics  . Smoking status: Never Smoker   . Smokeless tobacco: Never Used  . Alcohol Use: No    Review of Systems  Constitutional: Negative for fever. ENT: Negative for sore throat. Cardiovascular: Negative for chest pain. Respiratory: Negative for shortness  of breath. Gastrointestinal: Negative for vomiting and diarrhea. Positive for abdominal pain. See history of present illness Genitourinary: Negative for dysuria. Musculoskeletal: No myalgias or injuries. Skin: Negative for rash. Neurological: Negative for headaches   10-point ROS otherwise negative.  ____________________________________________   PHYSICAL EXAM:  VITAL SIGNS: ED Triage Vitals  Enc Vitals Group     BP 11/27/14 1732 139/78 mmHg     Pulse Rate 11/27/14 1732 68     Resp 11/27/14 1732 16     Temp 11/27/14 1732 98.3 F (36.8 C)     Temp Source 11/27/14 1732 Oral     SpO2 11/27/14 1732 100 %     Weight 11/27/14 1732 138 lb (62.596 kg)     Height 11/27/14 1732 5\' 3"  (1.6 m)     Head Cir --      Peak Flow --      Pain Score 11/27/14 1754 7     Pain Loc --      Pain Edu? --      Excl. in [place]? --     Constitutional: Alert and oriented. Some discomfort but no distress. ENT   Head:  Normocephalic and atraumatic.   Nose: No congestion/rhinnorhea. Cardiovascular: Normal rate, regular rhythm, no murmur noted Respiratory:  Normal respiratory effort, no tachypnea.    Breath sounds are clear and equal bilaterally.  Gastrointestinal: Soft. Tenderness in the epigastric and left upper abdominal area. No distention.  Back: No muscle spasm, no tenderness, no CVA tenderness. Musculoskeletal: No deformity noted. Nontender with normal range of motion in all extremities.  No noted edema. Neurologic:  Normal speech and language. No gross focal neurologic deficits are appreciated.  Skin:  Skin is warm, dry. No rash noted. Psychiatric: Mood and affect are normal. Speech and behavior are normal.  ____________________________________________    LABS (pertinent positives/negatives)  Labs Reviewed  COMPREHENSIVE METABOLIC PANEL - Abnormal; Notable for the following:    BUN 29 (*)    Creatinine, Ser 1.29 (*)    Calcium 8.8 (*)    GFR calc non Af Amer 47 (*)    GFR calc Af Amer 55 (*)    All other components within normal limits  URINALYSIS COMPLETEWITH MICROSCOPIC ([facility] ONLY) - Abnormal; Notable for the following:    Color, Urine YELLOW (*)    APPearance CLEAR (*)    Ketones, ur 1+ (*)    Bacteria, UA RARE (*)    Squamous Epithelial / LPF 0-5 (*)    All other components within normal limits  LIPASE, BLOOD  CBC     ____________________________________________  RADIOLOGY  CT, abdomen and pelvis: Pending  ____________________________________________   INITIAL IMPRESSION / ASSESSMENT AND PLAN / ED COURSE  Pertinent labs & imaging results that were available during my care of the patient were reviewed by me and considered in my medical decision making (see chart for details).  Unclear cause of abdominal pain in this 48 year old female. The pain is not in the right upper quadrant and there is no indication of a Murphy's sign. Initially, given the epigastric discomfort,  I discussed obtaining an ultrasound with the patient, but with further consideration and examination, I have very low suspicion for biliary disease. We'll obtain a CT scan of the abdomen and pelvis with a focus of looking at the intestinal track and the spleen for possible pathology. The patient and her husband agree with this.  I will ask one of my colleagues to follow-up on the CT results. The patient and  her husband know that they'll be a transitioning care.  ____________________________________________   FINAL CLINICAL IMPRESSION(S) / ED DIAGNOSES  Final diagnoses:  Left upper quadrant pain      Ahmed Prima, MD 11/27/14 2240

## 2014-11-28 MED ORDER — SUCRALFATE 1 G PO TABS: 1.0000 g | ORAL_TABLET | Freq: Two times a day (BID) | ORAL | Status: DC

## 2014-11-28 NOTE — Discharge Instructions (Signed)
Abdominal Pain °Many things can cause abdominal pain. Usually, abdominal pain is not caused by a disease and will improve without treatment. It can often be observed and treated at home. Your health care provider will do a physical exam and possibly order blood tests and X-rays to help determine the seriousness of your pain. However, in many cases, more time must pass before a clear cause of the pain can be found. Before that point, your health care provider may not know if you need more testing or further treatment. °HOME CARE INSTRUCTIONS  °Monitor your abdominal pain for any changes. The following actions may help to alleviate any discomfort you are experiencing: °· Only take over-the-counter or prescription medicines as directed by your health care provider. °· Do not take laxatives unless directed to do so by your health care provider. °· Try a clear liquid diet (broth, tea, or water) as directed by your health care provider. Slowly move to a bland diet as tolerated. °SEEK MEDICAL CARE IF: °· You have unexplained abdominal pain. °· You have abdominal pain associated with nausea or diarrhea. °· You have pain when you urinate or have a bowel movement. °· You experience abdominal pain that wakes you in the night. °· You have abdominal pain that is worsened or improved by eating food. °· You have abdominal pain that is worsened with eating fatty foods. °· You have a fever. °SEEK IMMEDIATE MEDICAL CARE IF:  °· Your pain does not go away within 2 hours. °· You keep throwing up (vomiting). °· Your pain is felt only in portions of the abdomen, such as the right side or the left lower portion of the abdomen. °· You pass bloody or black tarry stools. °MAKE SURE YOU: °· Understand these instructions.   °· Will watch your condition.   °· Will get help right away if you are not doing well or get worse.   °Document Released: 11/20/2004 Document Revised: 02/15/2013 Document Reviewed: 10/20/2012 °[employer]® Patient Information  ©2015 [employer]. This information is not intended to replace advice given to you by your health care provider. Make sure you discuss any questions you have with your health care provider. ° °Gastritis, Adult °Gastritis is soreness and swelling (inflammation) of the lining of the stomach. Gastritis can develop as a sudden onset (acute) or long-term (chronic) condition. If gastritis is not treated, it can lead to stomach bleeding and ulcers. °CAUSES  °Gastritis occurs when the stomach lining is weak or damaged. Digestive juices from the stomach then inflame the weakened stomach lining. The stomach lining may be weak or damaged due to viral or bacterial infections. One common bacterial infection is the Helicobacter pylori infection. Gastritis can also result from excessive alcohol consumption, taking certain medicines, or having too much acid in the stomach.  °SYMPTOMS  °In some cases, there are no symptoms. When symptoms are present, they may include: °· Pain or a burning sensation in the upper abdomen. °· Nausea. °· Vomiting. °· An uncomfortable feeling of fullness after eating. °DIAGNOSIS  °Your caregiver may suspect you have gastritis based on your symptoms and a physical exam. To determine the cause of your gastritis, your caregiver may perform the following: °· Blood or stool tests to check for the H pylori bacterium. °· Gastroscopy. A thin, flexible tube (endoscope) is passed down the esophagus and into the stomach. The endoscope has a light and camera on the end. Your caregiver uses the endoscope to view the inside of the stomach. °· Taking a tissue sample (biopsy)   from the stomach to examine under a microscope. °TREATMENT  °Depending on the cause of your gastritis, medicines may be prescribed. If you have a bacterial infection, such as an H pylori infection, antibiotics may be given. If your gastritis is caused by too much acid in the stomach, H2 blockers or antacids may be given. Your caregiver may  recommend that you stop taking aspirin, ibuprofen, or other nonsteroidal anti-inflammatory drugs (NSAIDs). °HOME CARE INSTRUCTIONS °· Only take over-the-counter or prescription medicines as directed by your caregiver. °· If you were given antibiotic medicines, take them as directed. Finish them even if you start to feel better. °· Drink enough fluids to keep your urine clear or pale yellow. °· Avoid foods and drinks that make your symptoms worse, such as: °¨ Caffeine or alcoholic drinks. °¨ Chocolate. °¨ Peppermint or mint flavorings. °¨ Garlic and onions. °¨ Spicy foods. °¨ Citrus fruits, such as oranges, lemons, or limes. °¨ Tomato-based foods such as sauce, chili, salsa, and pizza. °¨ Fried and fatty foods. °· Eat small, frequent meals instead of large meals. °SEEK IMMEDIATE MEDICAL CARE IF:  °· You have black or dark red stools. °· You vomit blood or material that looks like coffee grounds. °· You are unable to keep fluids down. °· Your abdominal pain gets worse. °· You have a fever. °· You do not feel better after 1 week. °· You have any other questions or concerns. °MAKE SURE YOU: °· Understand these instructions. °· Will watch your condition. °· Will get help right away if you are not doing well or get worse. °Document Released: 02/04/2001 Document Revised: 08/12/2011 Document Reviewed: 03/26/2011 °ExitCare® Patient Information ©2015 [employer]. This information is not intended to replace advice given to you by your health care provider. Make sure you discuss any questions you have with your health care provider. ° °

## 2014-11-28 NOTE — ED Notes (Signed)
Patient here from CDU for abdominal pain. Awaiting CT. Husband at bedside.

## 2014-11-28 NOTE — ED Notes (Signed)
Patient with no complaints at this time. Respirations even and unlabored. Skin warm/dry. Discharge instructions reviewed with patient at this time. Patient given opportunity to voice concerns/ask questions. IV removed per policy and band-aid applied to site. Patient discharged at this time and left Emergency Department with steady gait.  

## 2014-11-29 ENCOUNTER — Ambulatory Visit: Payer: BC Managed Care – PPO | Attending: Family Medicine | Admitting: Physical Therapy

## 2014-11-29 DIAGNOSIS — R279 Unspecified lack of coordination: Secondary | ICD-10-CM | POA: Insufficient documentation

## 2014-11-29 DIAGNOSIS — M629 Disorder of muscle, unspecified: Secondary | ICD-10-CM | POA: Insufficient documentation

## 2014-11-30 NOTE — Patient Instructions (Addendum)
Resume abdominal scar massage and abdominal massage (light pressure)  Bodv scan Vocal toning for diaphragmatic training
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
## 2014-12-07 ENCOUNTER — Ambulatory Visit: Payer: BC Managed Care – PPO | Admitting: Physical Therapy

## 2014-12-07 DIAGNOSIS — M629 Disorder of muscle, unspecified: Secondary | ICD-10-CM

## 2014-12-07 DIAGNOSIS — R279 Unspecified lack of coordination: Secondary | ICD-10-CM
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
## 2014-12-15 ENCOUNTER — Ambulatory Visit: Payer: BC Managed Care – PPO | Admitting: Physical Therapy

## 2014-12-15 DIAGNOSIS — R279 Unspecified lack of coordination: Secondary | ICD-10-CM

## 2014-12-15 DIAGNOSIS — M629 Disorder of muscle, unspecified: Secondary | ICD-10-CM

## 2014-12-15 NOTE — Patient Instructions (Addendum)
Am/ pm stretch /relaxation routine with body scan   Dynamic stabilization 2 and 4 only   -10x 3  Pelvic Tilt 10x    Happy baby    5 breaths  W/ strap    Knee rocking side to side   20 x     Froggy  -prone, knees bent, heels together, exhale heel apart 45 deg    10 x     --------------------------  Asian squat    3-4x /day     Chair pose   3-4 x /day     Hula movement   Any where  ___________________

## 2014-12-16 NOTE — Therapy (Deleted)
[place] MAIN [facility] SERVICES [address] [place], [place], [address] Phone: [phone]   Fax:  [phone]  Patient Details  Name: Sabrina Baxter MRN: [id number] Date of Birth: 1965/09/08 Referring Provider:  Valerie Roys, DO  Encounter Date: 12/15/2014   Jerl Mina  ,PT, DPT, E-RYT  12/16/2014, 7:14 PM  [place] MAIN [facility] SERVICES [address] [place], [place], [address] Phone: [phone]   Fax:  [phone]
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
## 2014-12-21 ENCOUNTER — Ambulatory Visit: Payer: BC Managed Care – PPO | Admitting: Physical Therapy

## 2014-12-21 DIAGNOSIS — M629 Disorder of muscle, unspecified: Secondary | ICD-10-CM

## 2014-12-21 DIAGNOSIS — R279 Unspecified lack of coordination: Secondary | ICD-10-CM

## 2014-12-21 NOTE — Patient Instructions (Signed)
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
## 2014-12-25 ENCOUNTER — Encounter: Payer: BC Managed Care – PPO | Admitting: Physical Therapy

## 2014-12-28 ENCOUNTER — Ambulatory Visit: Payer: BC Managed Care – PPO | Attending: Family Medicine | Admitting: Physical Therapy

## 2014-12-28 DIAGNOSIS — R279 Unspecified lack of coordination: Secondary | ICD-10-CM

## 2014-12-28 DIAGNOSIS — M629 Disorder of muscle, unspecified: Secondary | ICD-10-CM | POA: Diagnosis present
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
## 2015-01-05 ENCOUNTER — Ambulatory Visit: Payer: BC Managed Care – PPO | Admitting: Physical Therapy

## 2015-01-05 DIAGNOSIS — M629 Disorder of muscle, unspecified: Secondary | ICD-10-CM

## 2015-01-05 DIAGNOSIS — R279 Unspecified lack of coordination: Secondary | ICD-10-CM
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
## 2015-01-17 ENCOUNTER — Encounter: Payer: Self-pay | Admitting: *Deleted

## 2015-01-17 ENCOUNTER — Ambulatory Visit: Payer: BC Managed Care – PPO | Admitting: Physical Therapy

## 2015-01-17 DIAGNOSIS — M629 Disorder of muscle, unspecified: Secondary | ICD-10-CM | POA: Diagnosis not present

## 2015-01-17 DIAGNOSIS — R279 Unspecified lack of coordination: Secondary | ICD-10-CM

## 2015-01-17 NOTE — Therapy (Signed)
[place] MAIN [facility] SERVICES [address] [place], [place], [address] Phone: [phone]   Fax:  [phone]  Physical Therapy Treatment  Patient Details  Name: Sabrina Baxter MRN: [id number] Date of Birth: 1966/01/27 Referring Provider: Park Liter  Encounter Date: 01/17/2015      PT End of Session - 01/17/15 1721    Visit Number 13   Date for PT Re-Evaluation 02/14/15   PT Start Time 1500   PT Stop Time 1605   PT Time Calculation (min) 65 min      Past Medical History  Diagnosis Date  . Fibroid     49yo  . GERD (gastroesophageal reflux disease)   . Chronic renal failure     worsened with NSAIDs  . Abnormal thyroid function test   . Hyperreflexia   . Pudendal neuralgia   . Pelvic pain syndrome   . Chronic neck pain   . 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 Uterine fibroid   . 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 Abdominal hysterectomy  2012    hemorrhaging  . Appendectomy    . Uterine fibroid surgery  1983  . Nissen fundoplication  [id number]    There were no vitals filed for this visit.  Visit Diagnosis:  Lack of coordination  Fascial defect      Subjective Assessment - 01/17/15 1522    Subjective Pt reported she was pleased she was able to sing better than she has been able in a long time. Pt feels her medication has been helping her staomach pain and her posture seems to play a role as well. Pt reported she had a good week without "nervy and burny" sensation in her sacum.  Pt only felt sore. Pt was able to sit on her couch  for 30 min.             [facility] PT Assessment - 01/17/15 1555    Strength   Overall Strength --  cervical endurance extensor test 2 min   Palpation   Palpation comment decreased upper trap tensions, noted hypomT3, T 7  , decreased post Tx                     [facility] Adult PT Treatment/Exercise - 01/17/15 1556     Neuro Re-ed    Neuro Re-ed Details  HEP alignment, cues for scapular stabilization    Exercises   Other Exercises  "w" in hooklying yellow band, modified pilates with band 1-2 (bridging) 10 reps each      Manual Therapy   Joint Mobilization occipital release, PA in supine T3, T7                 PT Education - 01/17/15 1720    Education provided Yes   Education Details HEP    Person(s) Educated Patient   Methods Explanation;Demonstration;Tactile cues;Verbal cues;Handout   Comprehension Verbalized understanding;Returned demonstration             PT Long Term Goals - 01/17/15 1515    PT LONG TERM GOAL #1   Title (p) Pt will be able to tolerate 30 min on her couch (soft cushion) with 3-4/10 pain in order increase QOL. (01/17/15 :  1-2/10 pain)    Time 12   Period Weeks   Status (p) Achieved   PT LONG TERM GOAL #2  Title Pt will demo tolerating riding on [employer] bike for 10 min for one time without flare-up for 2 days.    Time 12   Period Weeks   Status Deferred   PT LONG TERM GOAL #3   Title Pt will decrease PSQI from 38% to < 35% in order improve quality of sleep.   Time 12   Period Weeks   Status Achieved   PT LONG TERM GOAL #4   Title Pt will increase her score on PSFS with laying on her back from 3/10 to >5/10 and sit comfortably from 3/10 to > 5/10 in order to return to ADLs.  (10/25/14: laying on back: 8/10, sit 7/10)    Time 12   Period Weeks   Status Achieved   PT LONG TERM GOAL #5   Title (p) Pt will report a decrease in abdominal pain from 7/10 to < 5/10 across one week in order to work. (   Time 12   Period Weeks   Status (p) Deferred   PT LONG TERM GOAL #6   Title Pt will be able lay in recliner 1 hour without a relapse of pain the next day that lasts less than half a day in order to improve QOL.    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    Time 12   Period Weeks   Status (p) Revised   PT LONG TERM GOAL #8   Title Pt will demo less forward head position in stance and decreased tenderness with palpation to C7/T1 in order to optimize spinal/ dural connection from cervical spine to sacrum and decrease her "sore/nervy" Sx.    Time 12   Period Weeks   Status (p) Achieved               Plan - 01/17/15 1725    Clinical Impression Statement Pt is progressing well with less forward head posture and increased propioceptive sense. Pt reported relieve in her cervicothoracic spine post-Tx. Initiated pt on light resistance strengthening of her back extensor mm. Antiicipate pt will continue to achieve her goals as she has achieved  a major goal this week with ability to tolerate sitting on her couch for 30 min.     Rehab Potential Good   Clinical Impairments Affecting Rehab Potential Hx of abdominal surgeries, Tarlov cysts on sacral nn    PT Frequency 1x / week   PT Duration 12 weeks   PT Treatment/Interventions Aquatic Therapy;Cryotherapy;Electrical Stimulation;Functional mobility training;Stair training;Gait training;Moist Heat;ADLs/Self Care Home Management;Therapeutic activities;Therapeutic exercise;Balance training;Neuromuscular re-education;Patient/family education;Passive range of motion;Scar mobilization;Manual techniques;Dry needling;Energy conservation;Taping        Problem List Patient Active Problem List   Diagnosis Date Noted  . GERD (gastroesophageal reflux disease)   . Chronic renal failure   . Pudendal neuralgia   . Pelvic pain syndrome     Jerl Mina ,PT, DPT, E-RYT  01/17/2015, 5:27 PM  [place] MAIN [facility] SERVICES [address] [place], [place], [address] Phone: [phone]   Fax:  [phone]  Name: Sabrina Baxter MRN: [id number] Date of Birth: 1965-02-28

## 2015-01-22 ENCOUNTER — Encounter: Payer: Self-pay | Admitting: *Deleted

## 2015-01-22 ENCOUNTER — Encounter
Admission: RE | Disposition: A | Discharge status: Home Health | Payer: Self-pay | Source: Ambulatory Visit | Attending: Gastroenterology

## 2015-01-22 ENCOUNTER — Ambulatory Visit: Payer: BC Managed Care – PPO | Admitting: Anesthesiology

## 2015-01-22 ENCOUNTER — Ambulatory Visit
Admission: RE | Admit: 2015-01-22 | Discharge: 2015-01-22 | Disposition: A | Discharge status: Home Health | Payer: BC Managed Care – PPO | Source: Ambulatory Visit | Attending: Gastroenterology | Admitting: Gastroenterology

## 2015-01-22 DIAGNOSIS — K219 Gastro-esophageal reflux disease without esophagitis: Secondary | ICD-10-CM | POA: Insufficient documentation

## 2015-01-22 DIAGNOSIS — Z881 Allergy status to other antibiotic agents status: Secondary | ICD-10-CM | POA: Diagnosis not present

## 2015-01-22 DIAGNOSIS — R946 Abnormal results of thyroid function studies: Secondary | ICD-10-CM | POA: Insufficient documentation

## 2015-01-22 DIAGNOSIS — R102 Pelvic and perineal pain: Secondary | ICD-10-CM | POA: Insufficient documentation

## 2015-01-22 DIAGNOSIS — Z880 Allergy status to penicillin: Secondary | ICD-10-CM | POA: Insufficient documentation

## 2015-01-22 DIAGNOSIS — K319 Disease of stomach and duodenum, unspecified: Secondary | ICD-10-CM | POA: Diagnosis not present

## 2015-01-22 DIAGNOSIS — G588 Other specified mononeuropathies: Secondary | ICD-10-CM | POA: Diagnosis not present

## 2015-01-22 DIAGNOSIS — K2970 Gastritis, unspecified, without bleeding: Secondary | ICD-10-CM | POA: Insufficient documentation

## 2015-01-22 DIAGNOSIS — G8929 Other chronic pain: Secondary | ICD-10-CM | POA: Insufficient documentation

## 2015-01-22 DIAGNOSIS — R1013 Epigastric pain: Secondary | ICD-10-CM | POA: Insufficient documentation

## 2015-01-22 DIAGNOSIS — R1012 Left upper quadrant pain: Secondary | ICD-10-CM | POA: Insufficient documentation

## 2015-01-22 DIAGNOSIS — N189 Chronic kidney disease, unspecified: Secondary | ICD-10-CM | POA: Diagnosis not present

## 2015-01-22 DIAGNOSIS — K621 Rectal polyp: Secondary | ICD-10-CM | POA: Diagnosis not present

## 2015-01-22 DIAGNOSIS — M533 Sacrococcygeal disorders, not elsewhere classified: Secondary | ICD-10-CM | POA: Diagnosis not present

## 2015-01-22 DIAGNOSIS — Z888 Allergy status to other drugs, medicaments and biological substances status: Secondary | ICD-10-CM | POA: Insufficient documentation

## 2015-01-22 DIAGNOSIS — Z1211 Encounter for screening for malignant neoplasm of colon: Secondary | ICD-10-CM | POA: Insufficient documentation

## 2015-01-22 DIAGNOSIS — M542 Cervicalgia: Secondary | ICD-10-CM | POA: Diagnosis not present

## 2015-01-22 DIAGNOSIS — K449 Diaphragmatic hernia without obstruction or gangrene: Secondary | ICD-10-CM | POA: Diagnosis not present

## 2015-01-22 DIAGNOSIS — Z79899 Other long term (current) drug therapy: Secondary | ICD-10-CM | POA: Insufficient documentation

## 2015-01-22 HISTORY — DX: Bursopathy, unspecified: M71.9

## 2015-01-22 HISTORY — DX: Leiomyoma of uterus, unspecified: D25.9

## 2015-01-22 HISTORY — DX: Unspecified disorder of synovium and tendon, unspecified shoulder: M71.9

## 2015-01-22 HISTORY — DX: Unspecified disorder of synovium and tendon, unspecified shoulder: M67.919

## 2015-01-22 HISTORY — PX: COLONOSCOPY WITH PROPOFOL: SHX5780

## 2015-01-22 HISTORY — PX: ESOPHAGOGASTRODUODENOSCOPY (EGD) WITH PROPOFOL: SHX5813

## 2015-01-22 LAB — HM COLONOSCOPY

## 2015-01-22 SURGERY — COLONOSCOPY WITH PROPOFOL
Anesthesia: General

## 2015-01-22 MED ORDER — SODIUM CHLORIDE 0.9 % IV SOLN
INTRAVENOUS | Status: DC
  Administered 2015-01-22: 1000 mL via INTRAVENOUS

## 2015-01-22 MED ORDER — PROPOFOL 10 MG/ML IV BOLUS
INTRAVENOUS | Status: DC | PRN
  Administered 2015-01-22: 50 mg via INTRAVENOUS

## 2015-01-22 MED ORDER — PROPOFOL 500 MG/50ML IV EMUL
INTRAVENOUS | Status: DC | PRN
  Administered 2015-01-22: 125 ug/kg/min via INTRAVENOUS

## 2015-01-22 MED ORDER — LACTATED RINGERS IV SOLN
INTRAVENOUS | Status: DC | PRN
  Administered 2015-01-22: 13:00:00 via INTRAVENOUS

## 2015-01-22 MED ORDER — SODIUM CHLORIDE 0.9 % IV SOLN: INTRAVENOUS | Status: DC

## 2015-01-22 NOTE — Anesthesia Preprocedure Evaluation (Signed)
Anesthesia Evaluation  Patient identified by MRN, date of birth, ID band Patient awake    Reviewed: Allergy & Precautions, H&P , NPO status , Patient's Chart, lab work & pertinent test results  History of Anesthesia Complications Negative for: history of anesthetic complications  Airway Mallampati: II  TM Distance: >3 FB Neck ROM: full    Dental no notable dental hx. (+) Teeth Intact   Pulmonary neg pulmonary ROS, neg shortness of breath,    Pulmonary exam normal breath sounds clear to auscultation       Cardiovascular Exercise Tolerance: Good (-) angina(-) Past MI and (-) DOE negative cardio ROS Normal cardiovascular exam Rhythm:regular Rate:Normal     Neuro/Psych  Neuromuscular disease negative psych ROS   GI/Hepatic negative GI ROS, Neg liver ROS, GERD  Controlled,  Endo/Other  negative endocrine ROS  Renal/GU Renal diseasenegative Renal ROS  negative genitourinary   Musculoskeletal   Abdominal   Peds  Hematology negative hematology ROS (+)   Anesthesia Other Findings Past Medical History:   Fibroid                                                        Comment:49yo   GERD (gastroesophageal reflux disease)                       Chronic renal failure                                          Comment:worsened with NSAIDs   Abnormal thyroid function test                               Hyperreflexia                                                Pelvic pain syndrome                                         Chronic neck pain                                            Uterine fibroid                                              Disorder of bursae and tendons in shoulder reg*              Pudendal neuralgia                                           Tarlov cysts  Comment:on S2 on MRI  Past Surgical History:   ABDOMINAL HYSTERECTOMY                           2012            Comment:hemorrhaging   APPENDECTOMY                                                  UTERINE FIBROID SURGERY                          1983         NISSEN FUNDOPLICATION                            2003        BMI    Body Mass Index   24.05 kg/m 2      Reproductive/Obstetrics negative OB ROS                             Anesthesia Physical Anesthesia Plan  ASA: III  Anesthesia Plan: General   Post-op Pain Management:    Induction:   Airway Management Planned:   Additional Equipment:   Intra-op Plan:   Post-operative Plan:   Informed Consent: I have reviewed the patients History and Physical, chart, labs and discussed the procedure including the risks, benefits and alternatives for the proposed anesthesia with the patient or authorized representative who has indicated his/her understanding and acceptance.   Dental Advisory Given  Plan Discussed with: Anesthesiologist, CRNA and Surgeon  Anesthesia Plan Comments:         Anesthesia Quick Evaluation

## 2015-01-22 NOTE — Op Note (Signed)
[facility] Gastroenterology Patient Name: Sabrina Baxter Procedure Date: 01/22/2015 1:26 PM MRN: [id number] Account #: [id number] Date of Birth: 03/19/1965 Admit Type: Outpatient Age: 49 Room: [facility] ENDO ROOM 3 Gender: Female Note Status: Finalized Procedure:         Upper GI endoscopy Indications:       Epigastric abdominal pain, Gastro-esophageal reflux disease Providers:         Lollie Sails, MD Referring MD:      Valerie Roys (Referring MD) Medicines:         Monitored Anesthesia Care Complications:     No immediate complications. Procedure:         Pre-Anesthesia Assessment:                    - ASA Grade Assessment: II - A patient with mild systemic                     disease.                    After obtaining informed consent, the endoscope was passed                     under direct vision. Throughout the procedure, the                     patient's blood pressure, pulse, and oxygen saturations                     were monitored continuously. The Endoscope was introduced                     through the mouth, and advanced to the third part of                     duodenum. The upper GI endoscopy was accomplished without                     difficulty. The patient tolerated the procedure well. The                     patient tolerated the procedure well. Findings:      The Z-line was variable. Biopsies were taken with a cold forceps for       histology.      The exam of the esophagus was otherwise normal.      Diffuse mild inflammation characterized by friability and granularity       was found in the gastric body, at the incisura and in the gastric       antrum. Biopsies were taken with a cold forceps for histology. Biopsies       were taken with a cold forceps for Helicobacter pylori testing.      Evidence of a Nissen fundoplication was found in the cardia. This was       characterized by healthy appearing mucosa. The lumen appears to be     somewhat turned with a more lateral appearance to the stomach.      The examined duodenum was normal. Impression:        - Z-line variable. Biopsied.                    - Gastritis. Biopsied.                    -  A Nissen fundoplication was found, characterized by                     healthy appearing mucosa.                    - Normal examined duodenum. Recommendation:    - Continue present medications.                    - Use Protonix (pantoprazole) 40 mg PO daily.                    - Await pathology results. Procedure Code(s): --- Professional ---                    [phone], Esophagogastroduodenoscopy, flexible, transoral;                     with biopsy, single or multiple Diagnosis Code(s): --- Professional ---                    K22.8, Other specified diseases of esophagus                    K29.70, Gastritis, unspecified, without bleeding                    Z98.89, Other specified postprocedural states                    R10.13, Epigastric pain                    K21.9, Gastro-esophageal reflux disease without esophagitis CPT copyright 2014 American Medical Association. All rights reserved. The codes documented in this report are preliminary and upon coder review may  be revised to meet current compliance requirements. Lollie Sails, MD 01/22/2015 1:50:42 PM This report has been signed electronically. Number of Addenda: 0 Note Initiated On: 01/22/2015 1:26 PM      [facility]

## 2015-01-22 NOTE — Anesthesia Postprocedure Evaluation (Signed)
Anesthesia Post Note  Patient: Sabrina Baxter  Procedure(s) Performed: Procedure(s) (LRB): COLONOSCOPY WITH PROPOFOL (N/A) ESOPHAGOGASTRODUODENOSCOPY (EGD) WITH PROPOFOL (N/A)  Patient location during evaluation: Endoscopy Anesthesia Type: General Level of consciousness: awake and alert Pain management: pain level controlled Vital Signs Assessment: post-procedure vital signs reviewed and stable Respiratory status: spontaneous breathing, nonlabored ventilation, respiratory function stable and patient connected to nasal cannula oxygen Cardiovascular status: blood pressure returned to baseline and stable Postop Assessment: no signs of nausea or vomiting Anesthetic complications: no    Last Vitals:  Filed Vitals:   01/22/15 1445 01/22/15 1455  BP: 117/75 117/68  Pulse: 58 54  Temp:    Resp: 16 12    Last Pain: There were no vitals filed for this visit.               Precious Haws Keyandre Pileggi

## 2015-01-22 NOTE — Transfer of Care (Signed)
Immediate Anesthesia Transfer of Care Note  Patient: Sabrina Baxter  Procedure(s) Performed: Procedure(s): COLONOSCOPY WITH PROPOFOL (N/A) ESOPHAGOGASTRODUODENOSCOPY (EGD) WITH PROPOFOL (N/A)  Patient Location: PACU and Endoscopy Unit  Anesthesia Type:General  Level of Consciousness: awake, alert  and oriented  Airway & Oxygen Therapy: Patient Spontanous Breathing and Patient connected to nasal cannula oxygen  Post-op Assessment: Report given to RN and Post -op Vital signs reviewed and stable  Post vital signs: Reviewed and stable  Last Vitals:  Filed Vitals:   01/22/15 1255  BP: 121/78  Pulse: 67  Temp: 36.8 C  Resp: 16    Complications: No apparent anesthesia complications

## 2015-01-22 NOTE — H&P (Signed)
Outpatient short stay form Pre-procedure 01/22/2015 1:18 PM Sabrina Sails MD  Primary Physician: Dr. Vincente Liberty  Reason for visit:  EGD and colonoscopy  History of present illness:  Patient is a 49 year old female presenting today for further evaluation in regards to epigastric pain and for colon cancer screening. She states that she went to the emergency room at [facility] on 11/27/2014 with epigastric left upper quadrant pain. He T scan at that time showed some prominent gastric mural thickening. She was placed on some Protonix and Carafate. She is not taking the Carafate however states she has been taking the Protonix regularly. Her symptoms are very much improved at this point. Tolerated her prep well for colonoscopy. She takes no aspirin  products. She does have a history of a Nissen fundoplication in 123456. No anticoagulation medications.  She has a history of chronic intermittent sacral pain which she takes multiple agents including Flexeril and in the past including voltaren gel and Lodine. She states she hasn't taken those for perhaps a year.   Current facility-administered medications:  .  0.9 %  sodium chloride infusion, , Intravenous, Continuous, Sabrina Sails, MD .  0.9 %  sodium chloride infusion, , Intravenous, Continuous, Sabrina Sails, MD, Last Rate: 20 mL/hr at 01/22/15 1313, 1,000 mL at 01/22/15 1313  Prescriptions prior to admission  Medication Sig Dispense Refill Last Dose  . Cholecalciferol (VITAMIN D3) 2000 UNITS capsule Take by mouth.   Taking  . cyclobenzaprine (FLEXERIL) 5 MG tablet Take 5 mg by mouth 3 (three) times daily as needed for muscle spasms.   Taking  . ketotifen (ZADITOR) 0.025 % ophthalmic solution 1 drop 2 (two) times daily.   Taking  . Magnesium 250 MG TABS Take by mouth.   Taking  . Multiple Vitamin (MULTI-DAY) TABS Take by mouth.   Taking  . pantoprazole (PROTONIX) 40 MG tablet Take 40 mg by mouth 2 (two) times  daily.   Taking  . sucralfate (CARAFATE) 1 G tablet Take 1 tablet (1 g total) by mouth 2 (two) times daily. 30 tablet 0 Taking  . traMADol-acetaminophen (ULTRACET) 37.5-325 MG per tablet Take by mouth as needed.   Taking  . triamcinolone cream (KENALOG) 0.1 % APPLY ON THE SKIN AS DIRECTED, DAILY UP TO TWICE A DAY TO HANDS UNTIL CLEAR, AVOID FACE, GROIN, AXIL  2 Taking     Allergies  Allergen Reactions  . Amoxicillin   . Diclofenac   . Mobic [Meloxicam]      Past Medical History  Diagnosis Date  . Fibroid     49yo  . GERD (gastroesophageal reflux disease)   . Chronic renal failure     worsened with NSAIDs  . Abnormal thyroid function test   . Hyperreflexia   . Pelvic pain syndrome   . Chronic neck pain   . Uterine fibroid   . 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 Pudendal neuralgia   . 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    Other:     Pertinant exam for procedure: Soft nontender nondistended bowel sounds positive normoactive.    Planned proceedures: EGD, colonoscopy and indicated procedures. I have discussed the risks benefits and complications  of procedures to include not limited to bleeding, infection, perforation and the risk of sedation and the patient wishes to proceed.    Sabrina Sails, MD Gastroenterology 01/22/2015  1:18 PM

## 2015-01-22 NOTE — Op Note (Signed)
[facility] Gastroenterology Patient Name: Sabrina Baxter Procedure Date: 01/22/2015 1:25 PM MRN: [id number] Account #: [id number] Date of Birth: 08-08-1965 Admit Type: Outpatient Age: 49 Room: [facility] ENDO ROOM 3 Gender: Female Note Status: Finalized Procedure:         Colonoscopy Indications:       Screening for colorectal malignant neoplasm, This is the                     patient's first colonoscopy Providers:         Lollie Sails, MD Referring MD:      Valerie Roys (Referring MD) Medicines:         Monitored Anesthesia Care Complications:     No immediate complications. Procedure:         Pre-Anesthesia Assessment:                    - ASA Grade Assessment: II - A patient with mild systemic                     disease.                    After obtaining informed consent, the colonoscope was                     passed under direct vision. Throughout the procedure, the                     patient's blood pressure, pulse, and oxygen saturations                     were monitored continuously. The Colonoscope was                     introduced through the anus and advanced to the the cecum,                     identified by appendiceal orifice and ileocecal valve. The                     colonoscopy was unusually difficult due to a tortuous                     colon. Successful completion of the procedure was aided by                     changing the patient to a supine position, changing the                     patient to a prone position and using manual pressure. The                     patient tolerated the procedure well. The quality of the                     bowel preparation was good. Findings:      An area of granular mucosa was found at the ileocecal valve. Biopsies       were taken with a cold forceps for histology.      A 1 mm polyp was found in the rectum. The polyp was sessile. The polyp       was removed with a cold biopsy forceps.  Resection and retrieval were  complete.      The digital rectal exam was normal.      The colon (entire examined portion) was significantly tortuous. Impression:        - Granularity at the ileocecal valve. Biopsied.                    - One 1 mm polyp in the rectum. Resected and retrieved.                    - Tortuous colon. Recommendation:    - Discharge patient to home.                    - Await pathology results.                    - Telephone GI clinic for pathology results in 1 week. Procedure Code(s): --- Professional ---                    [phone], Colonoscopy, flexible; with biopsy, single or                     multiple Diagnosis Code(s): --- Professional ---                    Z12.11, Encounter for screening for malignant neoplasm of                     colon                    K63.89, Other specified diseases of intestine                    K62.1, Rectal polyp                    Q43.8, Other specified congenital malformations of                     intestine CPT copyright 2014 American Medical Association. All rights reserved. The codes documented in this report are preliminary and upon coder review may  be revised to meet current compliance requirements. Lollie Sails, MD 01/22/2015 2:21:05 PM This report has been signed electronically. Number of Addenda: 0 Note Initiated On: 01/22/2015 1:25 PM Scope Withdrawal Time: 0 hours 8 minutes 6 seconds  Total Procedure Duration: 0 hours 25 minutes 22 seconds       [facility]

## 2015-01-24 ENCOUNTER — Encounter: Payer: Self-pay | Admitting: Gastroenterology

## 2015-01-25 ENCOUNTER — Ambulatory Visit: Payer: BC Managed Care – PPO | Attending: Family Medicine | Admitting: Physical Therapy

## 2015-01-25 DIAGNOSIS — R279 Unspecified lack of coordination: Secondary | ICD-10-CM | POA: Insufficient documentation

## 2015-01-25 DIAGNOSIS — M629 Disorder of muscle, unspecified: Secondary | ICD-10-CM

## 2015-01-25 LAB — SURGICAL PATHOLOGY

## 2015-01-25 NOTE — Patient Instructions (Addendum)
Bridging without bands Piriformis stretch on floor with shoulder depression/ retraction  Side plank modified on the floor (hip IR./ER)   To be incorporated into daily routine  During the work day:   Standing shvasana  (isometrics)  Wall presses with head, shoulder in mini squat 5 sec holds x 10    Pect stretch by the wall with sidebend of neck to release upper trap   5 breaths each side.

## 2015-01-26 NOTE — Therapy (Signed)
[place] MAIN [facility] SERVICES [address] [place], [place], [address] Phone: [phone]   Fax:  [phone]  Physical Therapy Treatment  Patient Details  Name: Sabrina Baxter MRN: [id number] Date of Birth: 05-23-1965 Referring Provider: Park Liter  Encounter Date: 01/25/2015      PT End of Session - 01/26/15 1353    Visit Number 14   Date for PT Re-Evaluation 02/14/15   PT Start Time [id number]   PT Stop Time [id number]   PT Time Calculation (min) 60 min      Past Medical History  Diagnosis Date  . Fibroid     49yo  . GERD (gastroesophageal reflux disease)   . Chronic renal failure     worsened with NSAIDs  . Abnormal thyroid function test   . Hyperreflexia   . Pelvic pain syndrome   . Chronic neck pain   . Uterine fibroid   . 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 Pudendal neuralgia   . 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 Abdominal hysterectomy  2012    hemorrhaging  . Appendectomy    . Uterine fibroid surgery  1983  . Nissen fundoplication  123456  . Colonoscopy with propofol N/A 01/22/2015    Procedure: COLONOSCOPY WITH PROPOFOL;  Surgeon: Lollie Sails, MD;  Location: [facility] ENDOSCOPY;  Service: Endoscopy;  Laterality: N/A;  . Esophagogastroduodenoscopy (egd) with propofol N/A 01/22/2015    Procedure: ESOPHAGOGASTRODUODENOSCOPY (EGD) WITH PROPOFOL;  Surgeon: Lollie Sails, MD;  Location: [facility] ENDOSCOPY;  Service: Endoscopy;  Laterality: N/A;    There were no vitals filed for this visit.  Visit Diagnosis:  Lack of coordination  Fascial defect      Subjective Assessment - 01/25/15 1623    Subjective Pt reported she has not had any "nervy" flare-up since last session. Pt no longer uses her lumbar pillow in her car which is a sign of pt being able to sit and have only tenderness in her tailbone instead of pain. Pt reported her HEP with bands (shoulder  flexion) causes soreness in her shoulders which she has had problems with in the past.             [facility] PT Assessment - 01/26/15 1349    Observation/Other Assessments   Observations noted limitations with shoulder abd and report of tenderness above 90 deg by wall  (exercises modified to accommodate Hx of shoulder problems)  Reported tenderness w/ shoulders in fish pose (withheld )   Posture/Postural Control   Posture Comments signifcant improvement with upright posture without cuing   scapular retraction and cervical retractionw/ minmal cuing                      [facility] Adult PT Treatment/Exercise - 01/26/15 1348    Self-Care   Other Self-Care Comments  educated on anatomy and physiology of shoulder and alignment in HEP to continue strengthening posterior chain of core mm and how HEP was modified to minimize risk of shoulder injuries while addressing outer core strengthening goals     Neuro Re-ed    Neuro Re-ed Details  see pt instructions with HEP   Exercises   Other Exercises  --  see pt instructions with HEP                     PT Long Term  Goals - 01/25/15 1705    PT LONG TERM GOAL #1   Title Pt will be able to tolerate 30 min on her couch (soft cushion) with 3-4/10 pain in order increase QOL. (01/17/15 :  1-2/10 pain)    Time 12   Period Weeks   Status Achieved   PT LONG TERM GOAL #2   Title Pt will demo tolerating riding on [employer] bike for 10 min for one time without flare-up for 2 days.    Time 12   Period Weeks   Status Deferred   PT LONG TERM GOAL #3   Title Pt will decrease PSQI from 38% to < 35% in order improve quality of sleep.   Time 12   Period Weeks   Status Achieved   PT LONG TERM GOAL #4   Title Pt will increase her score on PSFS with laying on her back from 3/10 to >5/10 and sit comfortably from 3/10 to > 5/10 in order to return to ADLs.  (10/25/14: laying on back: 8/10, sit 7/10)    Time 12   Period Weeks   Status Achieved    PT LONG TERM GOAL #5   Title Pt will report a decrease in abdominal pain from 7/10 to < 5/10 across one week in order to work. (   Time 12   Period Weeks   Status Deferred   Additional Long Term Goals   Additional Long Term Goals Yes   PT LONG TERM GOAL #6   Title Pt will be able lay in recliner 1 hour without a relapse of pain the next day that lasts less than half a day in order to improve QOL.    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    Time 12   Period Weeks   Status Achieved   PT LONG TERM GOAL #8   Title Pt will demo less forward head position in stance and decreased tenderness with palpation to C7/T1 in order to optimize spinal/ dural connection from cervical spine to sacrum and decrease her "sore/nervy" Sx.    Time 12   Period Weeks   Status Achieved               Plan - 01/26/15 1354    Clinical Impression Statement Pt is close to achieving all of her goals and may be ready for D/C at next session. Pt continues to show more upright alignment throughout her spine with increased propioception, soft tissue flexibility, and postural stability. Pt's HEP was modified to minimze issues related to past shoulder issues. Pt to return in 12 weeks to allow time for self-management and strengthening.      Pt will benefit from skilled therapeutic intervention in order to improve on the following deficits Decreased activity tolerance;Decreased strength;Postural dysfunction;Improper body mechanics;Decreased scar mobility;Increased fascial restricitons;Increased muscle spasms;Pain;Decreased safety awareness;Decreased endurance;Decreased coordination;Decreased range of motion;Impaired flexibility;Hypomobility;Decreased mobility;Hypermobility   Clinical Impairments Affecting Rehab Potential Hx of abdominal surgeries, Tarlov  cysts on sacral nn    PT Frequency 1x / week   PT Duration 12 weeks   PT Treatment/Interventions Aquatic Therapy;Cryotherapy;Electrical Stimulation;Functional mobility training;Stair training;Gait training;Moist Heat;ADLs/Self Care Home Management;Therapeutic activities;Therapeutic exercise;Balance training;Neuromuscular re-education;Patient/family education;Passive range of motion;Scar mobilization;Manual techniques;Dry needling;Energy conservation;Taping   Consulted and Agree with Plan of Care Patient  Problem List Patient Active Problem List   Diagnosis Date Noted  . GERD (gastroesophageal reflux disease)   . Chronic renal failure   . Pudendal neuralgia   . Pelvic pain syndrome     Jerl Mina ,PT, DPT, E-RYT  01/26/2015, 1:57 PM  [place] MAIN [facility] SERVICES [address] [place], [place], [address] Phone: [phone]   Fax:  [phone]  Name: Axie Knoche MRN: [id number] Date of Birth: [birth year]

## 2015-02-08 ENCOUNTER — Ambulatory Visit: Payer: BC Managed Care – PPO | Admitting: Physical Therapy

## 2015-02-08 DIAGNOSIS — R279 Unspecified lack of coordination: Secondary | ICD-10-CM

## 2015-02-08 DIAGNOSIS — M629 Disorder of muscle, unspecified: Secondary | ICD-10-CM

## 2015-02-09 NOTE — Therapy (Addendum)
[place] MAIN [facility] SERVICES [address] [place], [place], [address] Phone: [phone]   Fax:  [phone]  Physical Therapy Treatment [place] [facility] Note  Patient Details  Name: Sabrina Baxter MRN: [id number] Date of Birth: [birth year] Referring Provider: Park Liter  Encounter Date: 02/08/2015      PT End of Session - 02/12/15 2157    Visit Number 14   Date for PT Re-Evaluation 02/14/15   PT Start Time [id number]   PT Stop Time [id number]   PT Time Calculation (min) 60 min   Activity Tolerance Patient tolerated treatment well;No increased pain   Behavior During Therapy [facility] for tasks assessed/performed      Past Medical History  Diagnosis Date  . Fibroid     49yo  . GERD (gastroesophageal reflux disease)   . Chronic renal failure     worsened with NSAIDs  . Abnormal thyroid function test   . Hyperreflexia   . Pelvic pain syndrome   . Chronic neck pain   . Uterine fibroid   . 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 Pudendal neuralgia   . 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 Abdominal hysterectomy  2012    hemorrhaging  . Appendectomy    . Uterine fibroid surgery  1983  . Nissen fundoplication  123456  . Colonoscopy with propofol N/A 01/22/2015    Procedure: COLONOSCOPY WITH PROPOFOL;  Surgeon: Lollie Sails, MD;  Location: [facility] ENDOSCOPY;  Service: Endoscopy;  Laterality: N/A;  . Esophagogastroduodenoscopy (egd) with propofol N/A 01/22/2015    Procedure: ESOPHAGOGASTRODUODENOSCOPY (EGD) WITH PROPOFOL;  Surgeon: Lollie Sails, MD;  Location: [facility] ENDOSCOPY;  Service: Endoscopy;  Laterality: N/A;    There were no vitals filed for this visit.  Visit Diagnosis:  Lack of coordination  Fascial defect      Subjective Assessment - 02/12/15 2148    Subjective Pt reported she felt a slight tingling at her sacral nerves after last session 2 weeks ago but it dissipated  quickly. Barnie Mort has not had any "nervy" sensations in her sacrum nor mm spasms in her neck/shoulders. Pt has not had any flare-ups for a month and 2 weeks now.  Pt c/o  to feel mm knots in her spine today.  Pt reports her Sx have improved "A Great Deal Better" based on the [facility].             [facility] PT Assessment - 02/12/15 2150    Observation/Other Assessments   Observations upright posture noted    Palpation   Palpation comment tenderness/ tensions along L T 4  (decreased post-Tx)                      [facility] Adult PT Treatment/Exercise - 02/12/15 2154    Self-Care   Other Self-Care Comments  discussed goals, POC, continuation of PT to address remaining spinal deficits   Manual Therapy   Manual therapy comments distraction at occiput, thoracic segments in supine    Joint Mobilization sustained pressure on L iliocostalis T 4                 PT Education - 02/12/15 2156    Education provided Yes   Education Details HEP   Person(s) Educated Patient   Methods Explanation;Demonstration;Tactile cues;Verbal cues   Comprehension Verbalized understanding;Returned demonstration  PT Long Term Goals - 02/12/15 2200    PT LONG TERM GOAL #1   Title Pt will be able to tolerate 30 min on her couch (soft cushion) with 3-4/10 pain in order increase QOL. (01/17/15 :  1-2/10 pain)    Time 12   Period Weeks   Status Achieved   PT LONG TERM GOAL #2   Title Pt will demo tolerating riding on [employer] bike for 10 min for one time without flare-up for 2 days.    Time 12   Period Weeks   Status Deferred   PT LONG TERM GOAL #3   Title Pt will decrease PSQI from 38% to < 35% in order improve quality of sleep.   Time 12   Period Weeks   Status Achieved   PT LONG TERM GOAL #4   Title Pt will increase her score on PSFS with laying on her back from 3/10 to >5/10 and sit comfortably from 3/10 to > 5/10 in order to return to ADLs.  (10/25/14: laying on back: 8/10, sit  7/10)    Time 12   Period Weeks   Status Achieved   PT LONG TERM GOAL #5   Title Pt will report a decrease in abdominal pain from 7/10 to < 5/10 across one week in order to work. (   Time 12   Period Weeks   Status Deferred   PT LONG TERM GOAL #6   Title Pt will be able lay in recliner 1 hour without a relapse of pain the next day that lasts less than half a day in order to improve QOL.    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    Time 12   Period Weeks   Status Achieved   PT LONG TERM GOAL #8   Title Pt will demo less forward head position in stance and decreased tenderness with palpation to C7/T1 in order to optimize spinal/ dural connection from cervical spine to sacrum and decrease her "sore/nervy" Sx.    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    Time 12   Period Weeks   Status New               Plan - 02/12/15 2158    Clinical Impression Statement Pt has achieved 5/6 goals with improved  ability to tolerate sitting in restaurant booths across the past 2 months and sitting on her couch  for 30 min for 1 time. She has been able to self-manage with no  pelvic  pain flare-up for 1.5 months and demonstrated significantly improved  posture with more neutral spine and less of forward head posture. Pt's  musculoskeletal impairments around her sacrum and abdominal scar restrictions have improved with manual Tx  and her compliance to HEP/ motor control training. Her ability to  tolerate sitting on her couch for the first time without "nervy" flare-ups  occurred after she responded to manual Tx  that addressed her thoracic  Spine deficits. Pt also responded well to stress-management techniques and  neuroscience education.  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      Pt will benefit from skilled therapeutic intervention in order to improve on the following deficits Decreased activity tolerance;Decreased strength;Postural dysfunction;Improper body mechanics;Decreased scar mobility;Increased fascial restricitons;Increased muscle spasms;Pain;Decreased safety awareness;Decreased endurance;Decreased coordination;Decreased range of motion;Impaired flexibility;Hypomobility;Decreased mobility;Hypermobility   Clinical Impairments Affecting Rehab Potential Hx of abdominal surgeries, Tarlov cysts on sacral nn    PT Frequency 1x / week   PT Duration 12 weeks   PT Treatment/Interventions Aquatic Therapy;Cryotherapy;Electrical Stimulation;Functional mobility training;Stair training;Gait training;Moist Heat;ADLs/Self Care Home Management;Therapeutic activities;Therapeutic exercise;Balance training;Neuromuscular re-education;Patient/family education;Passive range of motion;Scar mobilization;Manual techniques;Dry needling;Energy conservation;Taping   Consulted and Agree with Plan of Care Patient        Problem List Patient Active Problem List   Diagnosis Date Noted  . GERD (gastroesophageal reflux disease)   . Chronic renal failure   . Pudendal neuralgia   . Pelvic pain syndrome     Jerl Mina ,PT, DPT, E-RYT  02/12/2015, 10:28 PM  [place] MAIN [facility] SERVICES [address] [place], [place], [address] Phone: [phone]   Fax:  [phone]  Name: Uniquia Glime MRN: [id number] Date of Birth: April 29, 1965

## 2015-02-12 NOTE — Addendum Note (Signed)
Addended by: Jerl Mina on: 02/12/2015 10:32 PM   Modules accepted: Orders

## 2015-02-14 ENCOUNTER — Other Ambulatory Visit: Payer: Self-pay | Admitting: Gastroenterology

## 2015-02-14 DIAGNOSIS — N9489 Other specified conditions associated with female genital organs and menstrual cycle: Secondary | ICD-10-CM | POA: Insufficient documentation

## 2015-02-14 DIAGNOSIS — R109 Unspecified abdominal pain: Secondary | ICD-10-CM

## 2015-02-20 ENCOUNTER — Ambulatory Visit
Admission: RE | Admit: 2015-02-20 | Discharge: 2015-02-20 | Disposition: A | Discharge status: Home / Self Care | Payer: BC Managed Care – PPO | Source: Ambulatory Visit | Attending: Gastroenterology | Admitting: Gastroenterology

## 2015-02-20 DIAGNOSIS — R109 Unspecified abdominal pain: Secondary | ICD-10-CM

## 2015-02-20 DIAGNOSIS — R1013 Epigastric pain: Secondary | ICD-10-CM | POA: Insufficient documentation

## 2015-02-20 MED ORDER — TECHNETIUM TC 99M MEBROFENIN IV KIT
5.0000 | PACK | Freq: Once | INTRAVENOUS | Status: AC | PRN
  Administered 2015-02-20: 5.29 via INTRAVENOUS

## 2015-02-21 ENCOUNTER — Encounter: Payer: Self-pay | Admitting: Family Medicine

## 2015-02-21 ENCOUNTER — Ambulatory Visit: Payer: BC Managed Care – PPO | Admitting: Physical Therapy

## 2015-02-21 DIAGNOSIS — R279 Unspecified lack of coordination: Secondary | ICD-10-CM

## 2015-02-21 DIAGNOSIS — M629 Disorder of muscle, unspecified: Secondary | ICD-10-CM

## 2015-02-21 LAB — EGD

## 2015-02-21 NOTE — Therapy (Signed)
[place] MAIN [facility] SERVICES [address] [place], [place], [address] Phone: [phone]   Fax:  [phone]  Physical Therapy Treatment  Patient Details  Name: Summerlyn Babauta MRN: [id number] Date of Birth: [birth year] Referring Provider: Park Liter  Encounter Date: 02/21/2015      PT End of Session - 02/21/15 1416    Visit Number 15   Date for PT Re-Evaluation 05/03/15   PT Start Time 1310   PT Stop Time 1410   PT Time Calculation (min) 60 min   Activity Tolerance Patient tolerated treatment well;No increased pain   Behavior During Therapy [facility] for tasks assessed/performed      Past Medical History  Diagnosis Date  . Fibroid     49yo  . GERD (gastroesophageal reflux disease)   . Chronic renal failure     worsened with NSAIDs  . Abnormal thyroid function test   . Hyperreflexia   . Pelvic pain syndrome   . Chronic neck pain   . Uterine fibroid   . 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 Pudendal neuralgia   . 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 Abdominal hysterectomy  2012    hemorrhaging  . Appendectomy    . Uterine fibroid surgery  1983  . Nissen fundoplication  123456  . Colonoscopy with propofol N/A 01/22/2015    Procedure: COLONOSCOPY WITH PROPOFOL;  Surgeon: Lollie Sails, MD;  Location: [facility] ENDOSCOPY;  Service: Endoscopy;  Laterality: N/A;  . Esophagogastroduodenoscopy (egd) with propofol N/A 01/22/2015    Procedure: ESOPHAGOGASTRODUODENOSCOPY (EGD) WITH PROPOFOL;  Surgeon: Lollie Sails, MD;  Location: [facility] ENDOSCOPY;  Service: Endoscopy;  Laterality: N/A;    There were no vitals filed for this visit.  Visit Diagnosis:  Lack of coordination  Fascial defect      Subjective Assessment - 02/21/15 1312    Subjective Pt reported she has finally been able to sit on her couch for 3 hrs after not being able to for 2 years. Pt only felt  "soreness" in her SIJ area after this period. Pt felt her upper back  has been feeling better than it has in a few years and she has been able to sit at the computer without "horrible pain " her left shoulder. Pt's remaining issue are: "soreness" in her SIJ after sitting on her couch and the midback "stabbing pain" when she bends forward 4-5/10 .                [facility] PT Assessment - 02/21/15 1330    Coordination   Fine Motor Movements are Fluid and Coordinated --  pre-Tx:multifids in standing R poor activation, post-Tx: inc   AROM   Overall AROM Comments pre-Tx: florward flexion 100 deg with pain at R L3 paraspinal  when spine is at 90 deg    Palpation   Spinal mobility decreased tensions/ tenderness in thoacic spine compared to last session   Palpation comment tensions/ tenderness along longissiimus on R at T  T12   R L3 increased mm tensions > L in standing                     [facility] Adult PT Treatment/Exercise - 02/21/15 1407    Neuro Re-ed    Neuro Re-ed Details  alignment for pallof press exercise, neutral psine in hooklying to decrease increased lordosis, breathing  into posterior ribcage    Exercises   Other Exercises  single leg to chest with strap  5 reps,  pallof press x 10 reps   yellow band    Moist Heat Therapy   Number Minutes Moist Heat 5 Minutes   Moist Heat Location Lumbar Spine   Manual Therapy   Joint Mobilization Longissimuss at T10-L3 On R , L T-7-10   Myofascial Release incostal mm T10-12 R                 PT Education - 02/21/15 1416    Education provided Yes   Education Details HEP   Person(s) Educated Patient   Methods Explanation;Demonstration;Tactile cues;Verbal cues;Handout   Comprehension Verbalized understanding;Returned demonstration             PT Long Term Goals - 02/21/15 1423    PT LONG TERM GOAL #1   Title Pt will be able to tolerate 30 min on her couch (soft cushion) with 3-4/10 pain in order increase QOL.  (01/17/15 :  1-2/10 pain)    Time 12   Period Weeks   Status Achieved   PT LONG TERM GOAL #2   Title Pt will demo tolerating riding on [employer] bike for 10 min for one time without flare-up for 2 days.    Time 12   Period Weeks   Status On-going   PT LONG TERM GOAL #3   Title Pt will decrease PSQI from 38% to < 35% in order improve quality of sleep.   Time 12   Period Weeks   Status Achieved   PT LONG TERM GOAL #4   Title Pt will increase her score on PSFS with laying on her back from 3/10 to >5/10 and sit comfortably from 3/10 to > 5/10 in order to return to ADLs.  (10/25/14: laying on back: 8/10, sit 7/10)    Time 12   Period Weeks   Status Achieved   PT LONG TERM GOAL #5   Title Pt will report a decrease in abdominal pain from 7/10 to < 5/10 across one week in order to work. (   Time 12   Period Weeks   Status Deferred   Additional Long Term Goals   Additional Long Term Goals Yes   PT LONG TERM GOAL #6   Title Pt will be able lay in recliner 1 hour without a relapse of pain the next day that lasts less than half a day in order to improve QOL.    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    Time 12   Period Weeks   Status Achieved   PT LONG TERM GOAL #8   Title Pt will demo less forward head position in stance and decreased tenderness with palpation to C7/T1 in order to optimize spinal/ dural connection from cervical spine to sacrum and decrease her "sore/nervy" Sx.    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    Time 12   Period Weeks   Status Achieved   PT  LONG TERM GOAL  #10   TITLE Pt will  demo decreased abdomina scar restriction in order to improve spinal flexibility to minimize risks for injury with fitness routine.    Time 12   Period Weeks   Status New   PT LONG TERM GOAL  #11   TITLE Pt will report decreased pain from 4/10 shooting pain at R upper lumbar area with forward folding to <2/10 pain in order to bend to pet cat.    Time 12   Period Weeks   Status New               Plan - 02/21/15 1417    Clinical Impression Statement Pt reports newly gained ability to sit her couch for 3hrs with no relapse of Sx. Pt presents with  less thoracic mm tensions compared to last session and was able to perform single knee to chest stretches without pain which is an improvement. Her remaining deficit include upper R lumbar area "stabbing pain" occurring only with forward spinal flexion which had persisted for previous months.  No change was made with Tx to reduce pain but multifidis on the R in standing did activate after manual Tx.  Progressed to resistance band (yellow) to strengthen multifidis and deleted downward facing dog pose to decrease overactivity of paraspinal mm.  Incorporated single knee to chest to release tensions in the parspinals. Pt demo'd improved motor control with diaphragmatic expansion into posterior ribcage. Also added self-scar massage over hysterectomy scar to  help minimize R lumbar pain due to fascial connections. Given pt's increased ability to sit on her recliner and couch for longer periods of time,  there is a likelihood that pt may be able to progress towards her goal to ride an [employer] bike, a goal  that was once deferred. Pt will continue to benefit from skilled PT.        Pt will benefit from skilled therapeutic intervention in order to improve on the following deficits Decreased activity tolerance;Decreased strength;Postural dysfunction;Improper body mechanics;Decreased scar mobility;Increased fascial restricitons;Increased muscle spasms;Pain;Decreased  safety awareness;Decreased endurance;Decreased coordination;Decreased range of motion;Impaired flexibility;Hypomobility;Decreased mobility;Hypermobility   Clinical Impairments Affecting Rehab Potential Hx of abdominal surgeries, Tarlov cysts on sacral nn    PT Frequency 1x/ every 2 weeks   PT Duration 12 weeks   PT Treatment/Interventions Aquatic Therapy;Cryotherapy;Electrical Stimulation;Functional mobility training;Stair training;Gait training;Moist Heat;ADLs/Self Care Home Management;Therapeutic activities;Therapeutic exercise;Balance training;Neuromuscular re-education;Patient/family education;Passive range of motion;Scar mobilization;Manual techniques;Dry needling;Energy conservation;Taping   Consulted and Agree with Plan of Care Patient        Problem List Patient Active Problem List   Diagnosis Date Noted  . GERD (gastroesophageal reflux disease)   . Chronic renal failure   . Pudendal neuralgia   . Pelvic pain syndrome     Jerl Mina ,PT, DPT, E-RYT  02/21/2015, 2:58 PM  [place] MAIN [facility] SERVICES [address] [place], [place], [address] Phone: [phone]   Fax:  [phone]  Name: Deva Maltez MRN: [id number] Date of Birth: 16-Sep-1965

## 2015-02-21 NOTE — Patient Instructions (Addendum)
Pallof press w/ yellow band at door knob  ( trunk rotation 15 deg to L x 15 reps, R x 15 reps) to replace bridges    Single knee to chest 5 reps with belt to replace down ward facing dog    Breathing: into posterior ribcage : inflate into the back body   Scar massage over abdomen while watching TV 5 min with olive oil coconut oil

## 2015-02-22 ENCOUNTER — Other Ambulatory Visit: Payer: Self-pay | Admitting: Family Medicine

## 2015-02-22 DIAGNOSIS — Z1231 Encounter for screening mammogram for malignant neoplasm of breast: Secondary | ICD-10-CM

## 2015-02-23 ENCOUNTER — Ambulatory Visit
Admission: RE | Admit: 2015-02-23 | Discharge: 2015-02-23 | Disposition: A | Discharge status: Home / Self Care | Payer: BC Managed Care – PPO | Source: Ambulatory Visit | Attending: Family Medicine | Admitting: Family Medicine

## 2015-02-23 DIAGNOSIS — Z1231 Encounter for screening mammogram for malignant neoplasm of breast: Secondary | ICD-10-CM | POA: Diagnosis present

## 2015-02-27 ENCOUNTER — Ambulatory Visit: Payer: BC Managed Care – PPO | Admitting: Family Medicine

## 2015-03-07 ENCOUNTER — Encounter: Payer: Self-pay | Admitting: Family Medicine

## 2015-03-07 ENCOUNTER — Ambulatory Visit (INDEPENDENT_AMBULATORY_CARE_PROVIDER_SITE_OTHER): Payer: BC Managed Care – PPO | Admitting: Family Medicine

## 2015-03-07 VITALS — BP 110/75 | HR 65 | Temp 98.6°F | Ht 62.3 in | Wt 136.0 lb

## 2015-03-07 DIAGNOSIS — M9903 Segmental and somatic dysfunction of lumbar region: Secondary | ICD-10-CM | POA: Diagnosis not present

## 2015-03-07 DIAGNOSIS — M489 Spondylopathy, unspecified: Secondary | ICD-10-CM

## 2015-03-07 DIAGNOSIS — R102 Pelvic and perineal pain: Secondary | ICD-10-CM

## 2015-03-07 DIAGNOSIS — M9904 Segmental and somatic dysfunction of sacral region: Secondary | ICD-10-CM

## 2015-03-07 DIAGNOSIS — N9489 Other specified conditions associated with female genital organs and menstrual cycle: Secondary | ICD-10-CM

## 2015-03-07 DIAGNOSIS — M9906 Segmental and somatic dysfunction of lower extremity: Secondary | ICD-10-CM | POA: Diagnosis not present

## 2015-03-07 DIAGNOSIS — M9905 Segmental and somatic dysfunction of pelvic region: Secondary | ICD-10-CM

## 2015-03-07 DIAGNOSIS — M9909 Segmental and somatic dysfunction of abdomen and other regions: Secondary | ICD-10-CM

## 2015-03-07 DIAGNOSIS — M999 Biomechanical lesion, unspecified: Secondary | ICD-10-CM

## 2015-03-07 NOTE — Progress Notes (Signed)
BP 110/75 mmHg  Pulse 65  Temp(Src) 98.6 F (37 C)  Ht 5' 2.3" (1.582 m)  Wt 136 lb (61.689 kg)  BMI 24.65 kg/m2  SpO2 96%  LMP 07/26/2010 (Approximate)   Subjective:    Patient ID: Sabrina Baxter, female    DOB: 06/20/1965, 50 y.o.   MRN: [id number]  HPI: Sabrina Baxter is a 50 y.o. female  Chief Complaint  Patient presents with  . Pelvic Pain   Sabrina Baxter has been doing well. Happy with the PT, seems like it's really helping. Has had days without pain and weeks without pain. She notes that her L leg started acting up about a week or so ago. Has been aching and numb and tingling. Hasn't had any new activities or injuries. Has been doing exercises regularly. She notes that OMT was helpful for her in the past and would like to try it again. No radiation. No other signs or symptoms. Better with movement, worse with sitting. No other concerns or complaints at this time.   Relevant past medical, surgical, family and social history reviewed and updated as indicated. Interim medical history since our last visit reviewed. Allergies and medications reviewed and updated.  Review of Systems  Constitutional: Negative.   Respiratory: Negative.   Cardiovascular: Negative.   Musculoskeletal: Negative.   Psychiatric/Behavioral: Negative.     Per HPI unless specifically indicated above     Objective:    BP 110/75 mmHg  Pulse 65  Temp(Src) 98.6 F (37 C)  Ht 5' 2.3" (1.582 m)  Wt 136 lb (61.689 kg)  BMI 24.65 kg/m2  SpO2 96%  LMP 07/26/2010 (Approximate)  Wt Readings from Last 3 Encounters:  03/07/15 136 lb (61.689 kg)  01/22/15 138 lb (62.596 kg)  11/27/14 138 lb (62.596 kg)    Physical Exam  Constitutional: She is oriented to person, place, and time. She appears well-developed and well-nourished. No distress.  HENT:  Head: Normocephalic and atraumatic.  Right Ear: Hearing normal.  Left Ear: Hearing normal.  Nose: Nose normal.  Eyes: Conjunctivae and lids are normal. Right eye  exhibits no discharge. Left eye exhibits no discharge. No scleral icterus.  Pulmonary/Chest: Effort normal. No respiratory distress.  Abdominal: Soft. She exhibits no distension and no mass. There is no tenderness. There is no rebound and no guarding.  Neurological: She is alert and oriented to person, place, and time.  Skin: Skin is warm, dry and intact. No rash noted. No erythema. No pallor.  Psychiatric: She has a normal mood and affect. Her speech is normal and behavior is normal. Judgment and thought content normal. Cognition and memory are normal.  Nursing note and vitals reviewed. Musculoskeletal:  Exam found Decreased ROM, Tissue texture changes, Tenderness to palpation and Asymmetry of patient's  lumbar, pelvis, sacrum, lower extremity and abdomen Osteopathic Structural Exam:   Lumbar: QL hypertonic on the L  Pelvis: Posterior L innominate, pubic bone superior sheer on the L  Sacrum: L on L torsion  Lower Extremity: IT band, hamstring hypertonicity on the L, fascial strain from L hip into leg  Abdomen: pelvic diaphragm congested on on the L with pull into L hip    Results for orders placed or performed in visit on 02/21/15  HM COLONOSCOPY  Result Value Ref Range   HM Colonoscopy Repeat Colon 10 years   EGD  Result Value Ref Range   EGD        Assessment & Plan:   Problem List Items Addressed This Visit  Genitourinary   Pelvic pain syndrome - Primary    Doing much better today, stable without exacerbation. Doing much better with PT. She complains of neuropathic pain but she cannot tolerate neuroleptics. 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 May benefit from sclerotherapy for scar tissue from GYN procedures. That she is better from her flare after OMT is encouraging. Follow up PRN. She does have some functional and myofascial issues that may benefit from OMT. She was treated today as discussed below.              Other Visit Diagnoses    Lumbar  dysfunction        Segmental and somatic dysfunction of sacral region        Segmental dysfunction of pelvic region        Nonallopathic lesion of lower extremities        Nonallopathic lesion of abdomen          After verbal consent was obtained, patient was treated today with osteopathic manipulative medicine to the regions of the lumbar, pelvis, sacrum, abdomen and lower extremity using the techniques of myofascial release, counterstrain, muscle energy, HVLA and soft tissue. Areas of compensation relating to her primary pain source also treated. Patient tolerated the procedure well with good objective and good subjective improvement in symptoms. She left the room in good condition. She was advised to stay well hydrated and that she may have some soreness following the procedure. If not improving or worsening, she will call and come in. Home exercise program of stretches for IT band and gluts discussed and demonstrated today. Patient will do these stretches BID to before the point of pain, and will return for reevaluation  on a PRN basis.   Follow up plan: Return if symptoms worsen or fail to improve.

## 2015-03-07 NOTE — Assessment & Plan Note (Signed)
Doing much better today, stable without exacerbation. Doing much better with PT. She complains of neuropathic pain but she cannot tolerate neuroleptics. 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 May benefit from sclerotherapy for scar tissue from GYN procedures. That she is better from her flare after OMT is encouraging. Follow up PRN. She does have some functional and myofascial issues that may benefit from OMT. She was treated today as discussed below.

## 2015-03-12 ENCOUNTER — Ambulatory Visit: Payer: BC Managed Care – PPO | Attending: Family Medicine | Admitting: Physical Therapy

## 2015-03-12 DIAGNOSIS — M629 Disorder of muscle, unspecified: Secondary | ICD-10-CM | POA: Insufficient documentation

## 2015-03-12 DIAGNOSIS — R279 Unspecified lack of coordination: Secondary | ICD-10-CM | POA: Insufficient documentation
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
## 2015-03-18 NOTE — Patient Instructions (Signed)
Low cobra  5x  [place]  5x   Scar massage

## 2015-03-28 ENCOUNTER — Ambulatory Visit: Payer: BC Managed Care – PPO | Attending: Family Medicine | Admitting: Physical Therapy

## 2015-03-28 DIAGNOSIS — R279 Unspecified lack of coordination: Secondary | ICD-10-CM | POA: Diagnosis present

## 2015-03-28 DIAGNOSIS — M629 Disorder of muscle, unspecified: Secondary | ICD-10-CM | POA: Insufficient documentation

## 2015-03-28 NOTE — Patient Instructions (Addendum)
L knee out with fingers pulling over L iliac crest  30 x   Happy baby with strap (feet close together, knees wide)  5 breaths    __________________ Stretching at 3/4 mile:  Hip flexor stretch   Figure four over opposite leg (chair pose)   Extended side angle  L/R
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
## 2015-04-09 ENCOUNTER — Ambulatory Visit: Payer: BC Managed Care – PPO | Admitting: Physical Therapy

## 2015-04-09 DIAGNOSIS — R279 Unspecified lack of coordination: Secondary | ICD-10-CM | POA: Diagnosis not present

## 2015-04-09 DIAGNOSIS — M629 Disorder of muscle, unspecified: Secondary | ICD-10-CM
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
## 2015-04-18 ENCOUNTER — Encounter: Admitting: Physical Therapy

## 2015-05-02 ENCOUNTER — Encounter: Admitting: Physical Therapy

## 2015-05-03 ENCOUNTER — Ambulatory Visit: Payer: BC Managed Care – PPO | Attending: Family Medicine | Admitting: Physical Therapy

## 2015-05-03 DIAGNOSIS — R279 Unspecified lack of coordination: Secondary | ICD-10-CM

## 2015-05-03 DIAGNOSIS — M629 Disorder of muscle, unspecified: Secondary | ICD-10-CM | POA: Diagnosis present

## 2015-05-03 NOTE — Patient Instructions (Signed)
[email or web address]   Restorative at the end of the day    During the day: 6 directions of the spine [email or web address]   When not with a flare-up: feet propped on wall, strap over thigh for 90-90 position as a restorative pose to de-sensitive to eventually achieve knee to chest

## 2015-05-04 NOTE — Therapy (Addendum)
[place] MAIN [facility] SERVICES [address] [place], [place], [address] Phone: [phone]   Fax:  [phone]  Physical Therapy Treatment  Patient Details  Name: Sabrina Baxter MRN: [id number] Date of Birth: 13-Apr-1965 Referring Provider: Park Liter, DO  Encounter Date: 05/03/2015      PT End of Session - 05/04/15 1931    Visit Number 20   Date for PT Re-Evaluation 07/26/15   PT Start Time 1710   PT Stop Time 1810   PT Time Calculation (min) 60 min   Activity Tolerance Patient tolerated treatment well   Behavior During Therapy [facility] for tasks assessed/performed      Past Medical History  Diagnosis Date  . Fibroid     50yo  . GERD (gastroesophageal reflux disease)   . Chronic renal failure     worsened with NSAIDs  . Abnormal thyroid function test   . Hyperreflexia   . Pelvic pain syndrome   . Chronic neck pain   . Uterine fibroid   . 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 Pudendal neuralgia   . 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 Gastritis     Past Surgical History  Procedure Laterality Date  . Abdominal hysterectomy  2012    hemorrhaging  . Appendectomy    . Uterine fibroid surgery  1983  . Nissen fundoplication  123456  . Colonoscopy with propofol N/A 01/22/2015    Procedure: COLONOSCOPY WITH PROPOFOL;  Surgeon: Lollie Sails, MD;  Location: [facility] ENDOSCOPY;  Service: Endoscopy;  Laterality: N/A;  . Esophagogastroduodenoscopy (egd) with propofol N/A 01/22/2015    Procedure: ESOPHAGOGASTRODUODENOSCOPY (EGD) WITH PROPOFOL;  Surgeon: Lollie Sails, MD;  Location: [facility] ENDOSCOPY;  Service: Endoscopy;  Laterality: N/A;    There were no vitals filed for this visit.  Visit Diagnosis:  Lack of coordination  Fascial defect      Subjective Assessment - 05/03/15 1708    Subjective After last session one month ago, pt had one flare up which cleared quickly. Pt had been scared to perform    knee to chest  after last sesion . Pt tried knee to chest one month later after achieving it through PT session and was able to get to end range but it flare-up the day after.  Pt has felt frustrated that her back has been feeling tight over the past weeks.                                          [facility] PT Assessment - 05/04/15 1917    Assessment   Medical Diagnosis Pelvic pain   Referring Provider Park Liter, DO   Precautions   Precautions None   Restrictions   Weight Bearing Restrictions No   Balance Screen   Has the patient fallen in the past 6 months No   Observation/Other Assessments   Observations upright posture, no forward head position    AROM   Overall AROM Comments pt demo'd full range of hip abd.ER in supine but pt reported increased burning over sacrum, decreased burning with 90-90 deg of hip flexion   Palpation   Palpation comment no increased mm tensions noted along paraspinals, no fascial restrictions along sacrum , significantly increased scar mobility along abdomen  [place] Adult PT Treatment/Exercise - 05/04/15 1924    Self-Care   Other Self-Care Comments  biopsychosocial approaches, discussing her fear with previous painful positions and how to overcome them,possible reasons for exacerbation of pain ( pt  ways to desensitive pain w/ knee to chest position   Therapeutic Activites    Other Therapeutic Activities releasing spinal tensions with 6 direction of movement during work hours, decompressing spine with restorative yoga poses after work, Guided pt through forward fold twist . Pt tolerated better than supine position 2/2 pain    Neuro Re-ed    Neuro Re-ed Details  guided pt on how to desensitive fear of pain with graded movement. in hip flexion 90-90 deg for a few weeks, and gradually progressing to end range knee to chest. when pt is ready, addressing her goal to return to biking with intermediary plans to use NU step                  PT Education - 05/04/15 1931    Education Details HEP, biopsychosocial approaches    Person(s) Educated Patient   Methods Explanation;Demonstration;Tactile cues;Verbal cues;Handout   Comprehension Verbalized understanding;Returned demonstration             PT Long Term Goals - 05/04/15 1934    PT LONG TERM GOAL #1   Title Pt will be able to tolerate 30 min on her couch (soft cushion) with 3-4/10 pain in order increase QOL. (01/17/15 :  1-2/10 pain)    Time 12   Period Weeks   Status Achieved   PT LONG TERM GOAL #2   Title Pt will demo tolerating riding on [employer] bike for 10 min for one time without flare-up for 2 days.    Time 12   Period Weeks   Status On-going   PT LONG TERM GOAL #3   Title Pt will decrease PSQI from 38% to < 35% in order improve quality of sleep.   Time 12   Period Weeks   Status Achieved   PT LONG TERM GOAL #4   Title Pt will increase her score on PSFS with laying on her back from 3/10 to >5/10 and sit comfortably from 3/10 to > 5/10 in order to return to ADLs.  (10/25/14: laying on back: 8/10, sit 7/10)    Time 12   Period Weeks   Status Achieved   PT LONG TERM GOAL #5   Title Pt will report a decrease in abdominal pain from 7/10 to < 5/10 across one week in order to work. (   Time 12   Period Weeks   Status Deferred   PT LONG TERM GOAL #6   Title Pt will be able lay in recliner 1 hour without a relapse of pain the next day that lasts less than half a day in order to improve QOL.    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    Time 12   Period Weeks   Status Achieved   PT LONG TERM GOAL #8   Title Pt will demo less forward head position in stance and decreased tenderness with palpation to C7/T1 in order to optimize spinal/ dural connection from cervical spine to sacrum and decrease  her "sore/nervy" Sx.    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    Time 12   Period Weeks   Status Achieved   PT LONG TERM GOAL  #10   TITLE Pt will demo decreased abdominal scar restriction in order to improve spinal flexibility to minimize risks for injury with fitness routine.    Time 12   Period Weeks   Status Achieved   PT LONG TERM GOAL  #11   TITLE Pt will report decreased pain from 4/10 shooting pain at R upper lumbar area with forward folding to <2/10 pain in order to bend to pet cat.  (03/12/15: 3/10)    Time 12   Period Weeks   Status Achieved   PT LONG TERM GOAL  #12   TITLE Pt will demo full hip flexion bilaterally (single knee to chest) without pain in order to progress towards bicycling.    Time 12   Status Achieved               Plan - 05/04/15 1935    Clinical Impression Statement Pt has achieved 10/11 goals with significantly better managed pelvic pain, improved sleep, and increased ability to tolerate sitting on soft surfaces.  Pt no longer experiences pain on LLE with walking as her abdominal scar restriction has decreased significantly. Pt had one brief relapse of pelvic pain across the past month when she had no PT treatments which demonstrated improved self-management.However, pt continues to need biopsychosocial tools and skilled PT to minimize relapse of pain and to desensitive her response to deep knee flexion which required to achieve her goal related to biking. Today, pt arrived to PT with another relapse of pain after having tried knee to chest position without having practiced it in a graded manner over the past month.  Pt 's pain and report of tightness in her back decreased after session today.  A re-cert for more visits accompanies  this note in order to help pt progress towards her remaining goal.    Pt will benefit from skilled therapeutic intervention in order to improve on the following deficits Decreased activity tolerance;Decreased strength;Postural dysfunction;Improper body mechanics;Decreased scar mobility;Increased fascial restricitons;Increased muscle spasms;Pain;Decreased safety awareness;Decreased endurance;Decreased coordination;Decreased range of motion;Impaired flexibility;Hypomobility;Decreased mobility;Hypermobility   PT Treatment/Interventions Aquatic Therapy;Cryotherapy;Electrical Stimulation;Functional mobility training;Stair training;Gait training;Moist Heat;ADLs/Self Care Home Management;Therapeutic activities;Therapeutic exercise;Balance training;Neuromuscular re-education;Patient/family education;Passive range of motion;Scar mobilization;Manual techniques;Dry needling;Energy conservation;Taping   Consulted and Agree with Plan of Care Patient        Problem List Patient Active Problem List   Diagnosis Date Noted  . GERD (gastroesophageal reflux disease)   . Chronic renal failure   . Pudendal neuralgia   . Pelvic pain syndrome     Jerl Mina ,PT, DPT, E-RYT  05/04/2015, 7:50 PM  [place] MAIN [facility] SERVICES [address] [place], [place], [address] Phone: [phone]   Fax:  [phone]  Name: Sabrina Baxter MRN: [id number] Date of Birth: September 24, 1965

## 2015-05-08 NOTE — Addendum Note (Signed)
Addended by: Jerl Mina on: 05/08/2015 08:18 PM   Modules accepted: Orders

## 2015-05-16 ENCOUNTER — Ambulatory Visit: Payer: BC Managed Care – PPO | Admitting: Physical Therapy

## 2015-05-16 ENCOUNTER — Encounter: Admitting: Physical Therapy

## 2015-05-16 DIAGNOSIS — M629 Disorder of muscle, unspecified: Secondary | ICD-10-CM

## 2015-05-16 DIAGNOSIS — R279 Unspecified lack of coordination: Secondary | ICD-10-CM

## 2015-05-16 NOTE — Therapy (Signed)
[place] MAIN [facility] SERVICES [address] [place], [place], [address] Phone: [phone]   Fax:  [phone]  Physical Therapy Treatment  Patient Details  Name: Sabrina Baxter MRN: [id number] Date of Birth: March 20, 1965 Referring Provider: Park Liter, DO  Encounter Date: 05/16/2015      PT End of Session - 05/16/15 2237    Visit Number 21   Date for PT Re-Evaluation 07/26/15   PT Start Time [id number]   PT Stop Time 1700   PT Time Calculation (min) 55 min      Past Medical History  Diagnosis Date  . Fibroid     50yo  . GERD (gastroesophageal reflux disease)   . Chronic renal failure     worsened with NSAIDs  . Abnormal thyroid function test   . Hyperreflexia   . Pelvic pain syndrome   . Chronic neck pain   . Uterine fibroid   . 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 Pudendal neuralgia   . 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 Gastritis     Past Surgical History  Procedure Laterality Date  . Abdominal hysterectomy  2012    hemorrhaging  . Appendectomy    . Uterine fibroid surgery  1983  . Nissen fundoplication  123456  . Colonoscopy with propofol N/A 01/22/2015    Procedure: COLONOSCOPY WITH PROPOFOL;  Surgeon: Lollie Sails, MD;  Location: [facility] ENDOSCOPY;  Service: Endoscopy;  Laterality: N/A;  . Esophagogastroduodenoscopy (egd) with propofol N/A 01/22/2015    Procedure: ESOPHAGOGASTRODUODENOSCOPY (EGD) WITH PROPOFOL;  Surgeon: Lollie Sails, MD;  Location: [facility] ENDOSCOPY;  Service: Endoscopy;  Laterality: N/A;    There were no vitals filed for this visit.  Visit Diagnosis:  Lack of coordination  Fascial defect          [facility] PT Assessment - 05/16/15 1651    Observation/Other Assessments   Observations 2 min bicyling pedalling while seated with PT 's hands manually creating resisting as pedals  unable to tolerate reclined supta bandhakonasana (3 min max)                     [facility]  Adult PT Treatment/Exercise - 05/16/15 2236    Therapeutic Activites    Other Therapeutic Activities simulated pedalling 1.5 min with UE support on chairs, 2 min without, no pain reported   Neuro Re-ed    Neuro Re-ed Details  see pt instructions                PT Education - 05/16/15 2237    Education provided Yes   Education Details HEP, explained the gradual progression to bicycling, recommended buying Pedal exerciser    Person(s) Educated Patient   Methods Explanation;Tactile cues;Demonstration;Verbal cues;Handout   Comprehension Returned demonstration;Verbalized understanding             PT Long Term Goals - 05/04/15 1934    PT LONG TERM GOAL #1   Title Pt will be able to tolerate 30 min on her couch (soft cushion) with 3-4/10 pain in order increase QOL. (01/17/15 :  1-2/10 pain)    Time 12   Period Weeks   Status Achieved   PT LONG TERM GOAL #2   Title Pt will demo tolerating riding on [employer] bike for 10 min for one time without flare-up for 2 days.    Time 12   Period Weeks   Status On-going  PT LONG TERM GOAL #3   Title Pt will decrease PSQI from 38% to < 35% in order improve quality of sleep.   Time 12   Period Weeks   Status Achieved   PT LONG TERM GOAL #4   Title Pt will increase her score on PSFS with laying on her back from 3/10 to >5/10 and sit comfortably from 3/10 to > 5/10 in order to return to ADLs.  (10/25/14: laying on back: 8/10, sit 7/10)    Time 12   Period Weeks   Status Achieved   PT LONG TERM GOAL #5   Title Pt will report a decrease in abdominal pain from 7/10 to < 5/10 across one week in order to work. (   Time 12   Period Weeks   Status Deferred   PT LONG TERM GOAL #6   Title Pt will be able lay in recliner 1 hour without a relapse of pain the next day that lasts less than half a day in order to improve QOL.    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    Time 12   Period Weeks   Status Achieved   PT LONG TERM GOAL #8   Title Pt will demo less forward head position in stance and decreased tenderness with palpation to C7/T1 in order to optimize spinal/ dural connection from cervical spine to sacrum and decrease her "sore/nervy" Sx.    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    Time 12   Period Weeks   Status Achieved   PT LONG TERM GOAL  #10   TITLE Pt will demo decreased abdominal scar restriction in order to improve spinal flexibility to minimize risks for injury with fitness routine.    Time 12   Period Weeks   Status Achieved   PT LONG TERM GOAL  #11   TITLE Pt will report decreased pain from 4/10 shooting pain at R upper lumbar area with forward folding to <2/10 pain in order to bend to pet cat.  (03/12/15: 3/10)    Time 12   Period Weeks   Status Achieved   PT LONG TERM GOAL  #12   TITLE Pt will demo full hip flexion bilaterally (single knee to chest) without pain in order to progress towards bicycling.    Time 12   Status Achieved               Plan - 05/16/15 2238    Clinical Impression Statement Pt is progressing well towards her bicycling goal as she tolerated 2 min of simulated cycling (without UE support). Pt continues to tolerate reclined (semi fowler's position) after 31min but pt reported it did not flare-up as much as in the past. Anticipate pt will achieve her remaining goal with continued skilled PT.    Pt will benefit from skilled therapeutic intervention in order to improve on the following deficits Decreased activity tolerance;Decreased strength;Postural dysfunction;Improper body mechanics;Decreased scar  mobility;Increased fascial restricitons;Increased muscle spasms;Pain;Decreased safety awareness;Decreased endurance;Decreased coordination;Decreased range of  motion;Impaired flexibility;Hypomobility;Decreased mobility;Hypermobility   Rehab Potential Good   Clinical Impairments Affecting Rehab Potential Hx of abdominal surgeries, Tarlov cysts on sacral nn    PT Frequency Other (comment)  once week, every other, every 3 weeks   PT Duration 12 weeks   PT Treatment/Interventions Aquatic Therapy;Cryotherapy;Electrical Stimulation;Functional mobility training;Stair training;Gait training;Moist Heat;ADLs/Self Care Home Management;Therapeutic activities;Therapeutic exercise;Balance training;Neuromuscular re-education;Patient/family education;Passive range of motion;Scar mobilization;Manual techniques;Dry needling;Energy conservation;Taping        Problem List Patient Active Problem List   Diagnosis Date Noted  . GERD (gastroesophageal reflux disease)   . Chronic renal failure   . Pudendal neuralgia   . Pelvic pain syndrome     Jerl Mina ,PT, DPT, E-RYT  05/16/2015, 10:42 PM  [place] MAIN [facility] SERVICES [address] [place], [place], [address] Phone: [phone]   Fax:  [phone]  Name: Sabrina Baxter MRN: [id number] Date of Birth: 1965-08-14

## 2015-05-16 NOTE — Patient Instructions (Addendum)
Progress towards bicycling  Buy Pedal exerciser (see pint outs)   2 min intervals with deep core activation (exhalation/ inhalation)  X 3 reps spread out during the day    _______________  Stretches  Post bicycling or between sets   Gluts  _figure 4     IT _cross over with twist     Quads: _sideplank on forearm and pulling foot    Hamstrings:  _strap while on your back   Hip flexor    _hanging thigh off side of bed (lay diagonally)    _________________________________  Restorative:  Ardine Eng pose with bolster under trunk and rolled blanket under thighs   10 min (5 min with head turned R, 5 min head turned left)

## 2015-05-23 ENCOUNTER — Encounter: Admitting: Physical Therapy

## 2015-06-04 ENCOUNTER — Encounter: Admitting: Physical Therapy

## 2015-06-06 ENCOUNTER — Encounter: Admitting: Physical Therapy

## 2015-06-18 ENCOUNTER — Encounter: Admitting: Physical Therapy

## 2015-07-10 ENCOUNTER — Encounter: Admitting: Physical Therapy

## 2015-07-10 ENCOUNTER — Ambulatory Visit: Payer: BC Managed Care – PPO | Attending: Family Medicine | Admitting: Physical Therapy

## 2015-07-10 DIAGNOSIS — R279 Unspecified lack of coordination: Secondary | ICD-10-CM | POA: Insufficient documentation

## 2015-07-10 NOTE — Patient Instructions (Signed)
therawand use techniques 1-2 x week until you can notice the relaxed feeling of pelvic floor muscles with activities ( yoga poses, mini squats)   Strengthening the hip abductor/ external rotation muscles  For hip stability: Clam shells -->  sidetepping ---> sidetep + mini squats (knee behind toes) inhale squat, exhale rise   Strengthening arms:  _Plantigrade (sideways standing by desk)  sipderwoman palms to decrease weight on wrists Static hold for 5 sec x 5 times (without bending elbow)    _ One sides pushups with body perpendicular to wall  Bending elbow ~60 deg inhale, exhale =push wall away   Progressing posterior thoracolumbar connection to gluts: Bridges --> reverse table top bridges --> side crawl crab
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
## 2015-07-19 NOTE — Addendum Note (Signed)
Addended by: Jerl Mina on: 07/19/2015 04:47 PM   Modules accepted: Orders

## 2015-07-31 ENCOUNTER — Encounter: Admitting: Physical Therapy

## 2016-01-14 ENCOUNTER — Encounter: Payer: Self-pay | Admitting: Family Medicine

## 2016-01-14 ENCOUNTER — Ambulatory Visit (INDEPENDENT_AMBULATORY_CARE_PROVIDER_SITE_OTHER): Payer: BC Managed Care – PPO | Admitting: Family Medicine

## 2016-01-14 VITALS — BP 94/65 | HR 58 | Temp 98.2°F | Wt 141.3 lb

## 2016-01-14 DIAGNOSIS — N183 Chronic kidney disease, stage 3 unspecified: Secondary | ICD-10-CM

## 2016-01-14 DIAGNOSIS — M7631 Iliotibial band syndrome, right leg: Secondary | ICD-10-CM | POA: Diagnosis not present

## 2016-01-14 DIAGNOSIS — M9906 Segmental and somatic dysfunction of lower extremity: Secondary | ICD-10-CM | POA: Diagnosis not present

## 2016-01-14 MED ORDER — PANTOPRAZOLE SODIUM 20 MG PO TBEC: 20.0000 mg | DELAYED_RELEASE_TABLET | Freq: Every day | ORAL | 1 refills | Status: DC

## 2016-01-14 MED ORDER — DICLOFENAC SODIUM 3 % TD GEL: 1.0000 "application " | TRANSDERMAL | 4 refills | Status: DC | PRN

## 2016-01-14 NOTE — Progress Notes (Signed)
BP 94/65 (BP Location: Left Arm, Patient Position: Sitting, Cuff Size: Normal)   Pulse (!) 58   Temp 98.2 F (36.8 C)   Wt 141 lb 4.8 oz (64.1 kg)   LMP 07/26/2010 (Approximate)   SpO2 98%   BMI 25.60 kg/m    Subjective:    Patient ID: Sabrina Baxter, female    DOB: 02/12/1966, 50 y.o.   MRN: [id number]  HPI: Sabrina Baxter is a 50 y.o. female  Chief Complaint  Patient presents with  . Knee Pain    Right   KNEE PAIN Duration: at least a month Involved knee: right Mechanism of injury: unknown Location:diffuse- tingling down the middle, tight along the lateral, sore in the back Onset: sudden Severity: moderate  Quality:  Annoying, sore Frequency: intermittent Radiation: no Aggravating factors: stairs  Alleviating factors: nothing  Status: fluctuating Treatments attempted: none  Relief with NSAIDs?:  No NSAIDs Taken Weakness with weight bearing or walking: yes Sensation of giving way: yes Locking: no Popping: yes Bruising: no Swelling: no Redness: no Paresthesias/decreased sensation: yes Fevers: no  Relevant past medical, surgical, family and social history reviewed and updated as indicated. Interim medical history since our last visit reviewed. Allergies and medications reviewed and updated.  Review of Systems  Constitutional: Negative.   Respiratory: Negative.   Cardiovascular: Negative.   Musculoskeletal: Positive for arthralgias. Negative for back pain, gait problem, joint swelling, myalgias, neck pain and neck stiffness.  Neurological: Negative.   Psychiatric/Behavioral: Negative.     Per HPI unless specifically indicated above     Objective:    BP 94/65 (BP Location: Left Arm, Patient Position: Sitting, Cuff Size: Normal)   Pulse (!) 58   Temp 98.2 F (36.8 C)   Wt 141 lb 4.8 oz (64.1 kg)   LMP 07/26/2010 (Approximate)   SpO2 98%   BMI 25.60 kg/m   Wt Readings from Last 3 Encounters:  01/14/16 141 lb 4.8 oz (64.1 kg)  03/07/15 136 lb (61.7  kg)  01/22/15 138 lb (62.6 kg)    Physical Exam  Constitutional: She is oriented to person, place, and time. She appears well-developed and well-nourished. No distress.  HENT:  Head: Normocephalic and atraumatic.  Right Ear: Hearing normal.  Left Ear: Hearing normal.  Nose: Nose normal.  Eyes: Conjunctivae and lids are normal. Right eye exhibits no discharge. Left eye exhibits no discharge. No scleral icterus.  Pulmonary/Chest: Effort normal. No respiratory distress.  Abdominal: Soft. She exhibits no distension and no mass. There is no tenderness. There is no rebound and no guarding.  Musculoskeletal:  Negative anterior and posterior drawer tests, negative McMurray's, negative appley's compression and distraction  Neurological: She is alert and oriented to person, place, and time.  Skin: Skin is warm, dry and intact. No rash noted. No erythema. No pallor.  Psychiatric: She has a normal mood and affect. Her speech is normal and behavior is normal. Judgment and thought content normal. Cognition and memory are normal.  Musculoskeletal:  Exam found Decreased ROM, Tissue texture changes, Tenderness to palpation and Asymmetry of patient's  lower extremity Osteopathic Structural Exam:   R Lower Extremity: Posterior fibular head, IT band hypertonicity, hamstring hypertonicity, internally rotated tibia   Results for orders placed or performed in visit on 02/21/15  [facility] COLONOSCOPY  Result Value Ref Range   [facility] Colonoscopy Repeat Colon 10 years   EGD  Result Value Ref Range   EGD        Assessment & Plan:  Problem List Items Addressed This Visit      Genitourinary   Chronic renal failure    Cannot tolerate oral NSAIDs due to renal function. Would benefit from voltaren. Rx sent to her pharmacy. We expect a PA. Cannot take oral NSAIDs due to renal function.       Other Visit Diagnoses    It band syndrome, right    -  Primary   Stretches given. Likely due to hiking. She does have some  somatic dysfunction that is contributing. I think she would benefit from OMT. Treated today as below.   Osteopathic lesion of lower extremity           Follow up plan: Return in about 3 weeks (around 02/04/2016).

## 2016-01-14 NOTE — Assessment & Plan Note (Signed)
Cannot tolerate oral NSAIDs due to renal function. Would benefit from voltaren. Rx sent to her pharmacy. We expect a PA. Cannot take oral NSAIDs due to renal function.

## 2016-01-19 IMAGING — CT CT ABD-PELV W/ CM
1 of 3 series · 14 of 32 positions shown, 19 images · IV contrast (omnipaque)
Comparison: None.

CLINICAL DATA: Epigastric and left upper abdominal pain, onset 5
days ago.

EXAM:
CT ABDOMEN AND PELVIS WITH CONTRAST
TECHNIQUE: Multidetector CT imaging of the abdomen and pelvis was performed
using the standard protocol following bolus administration of
intravenous contrast.
CONTRAST:  80mL OMNIPAQUE IOHEXOL 300 MG/ML  SOLN

[Series 2: routine abd pel with · axial · 0.72mm/px · z∈[-1038,-678]mm · 14 of 82 slices shown, 19 images]
[im 5/82  soft-tissue]
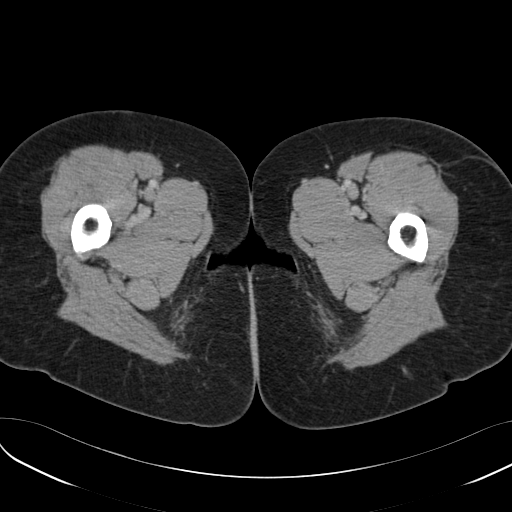
[im 5/82  bone]
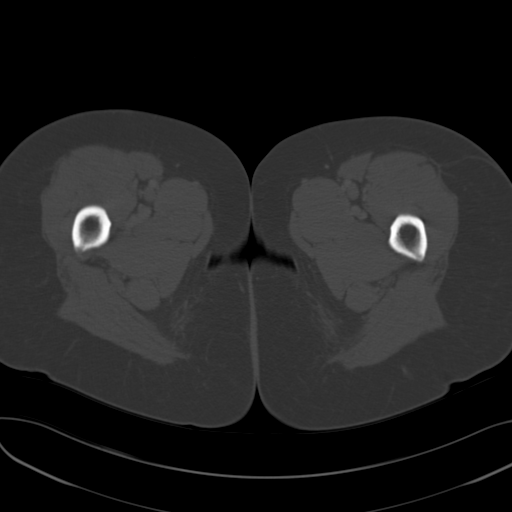
[im 10/82  soft-tissue]
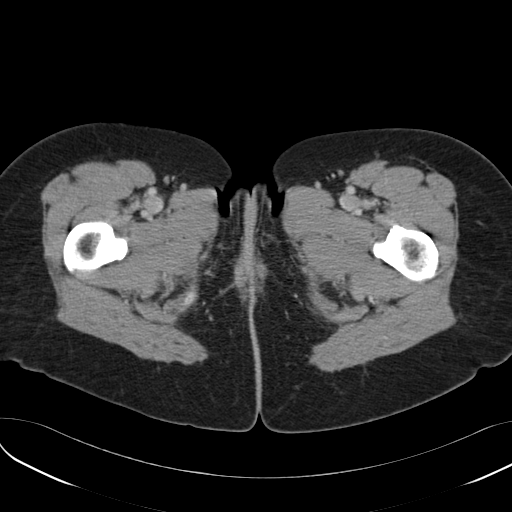
[im 19/82  soft-tissue]
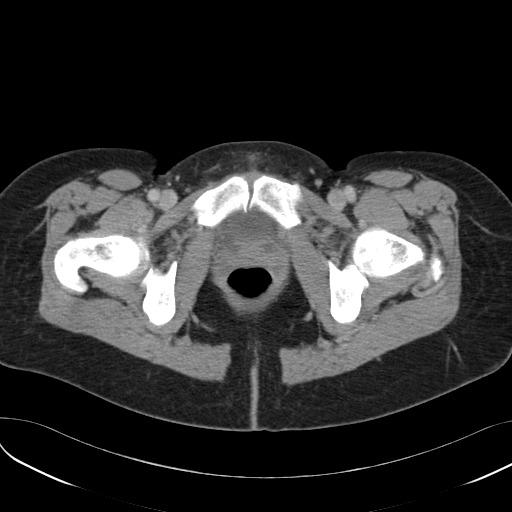
[im 23/82  soft-tissue]
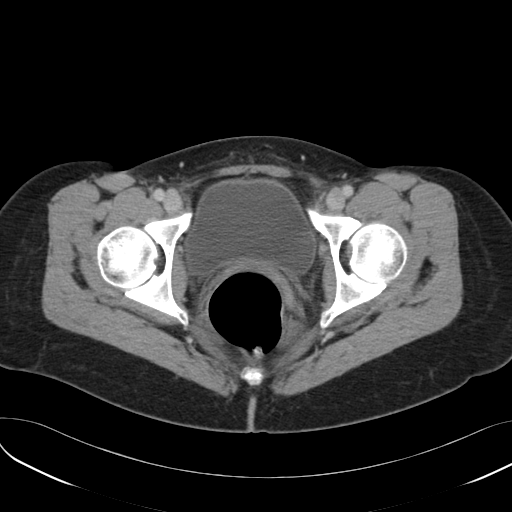
[im 28/82  soft-tissue]
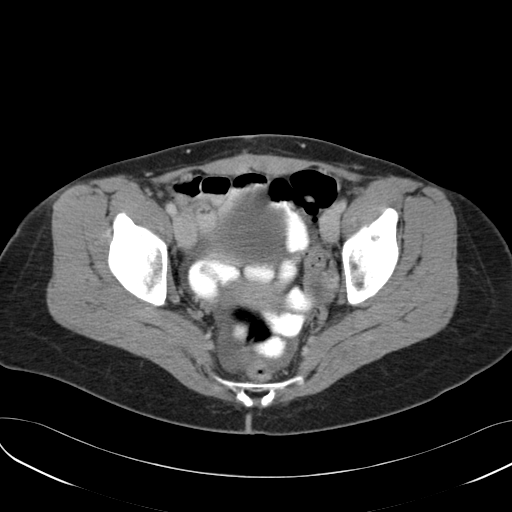
[im 37/82  soft-tissue]
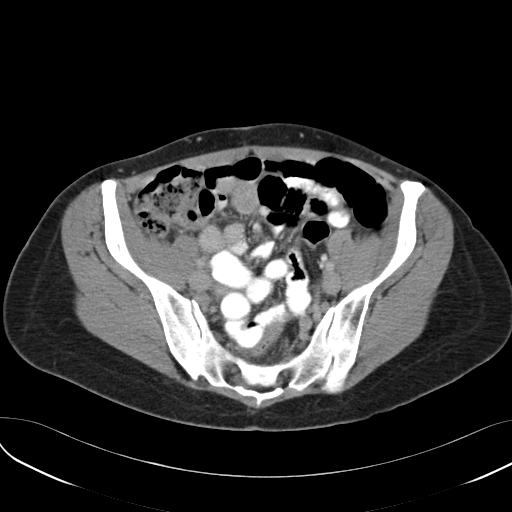
[im 41/82  soft-tissue]
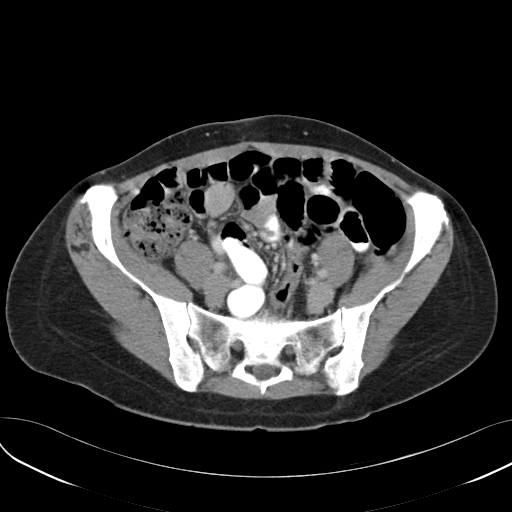
[im 46/82  soft-tissue]
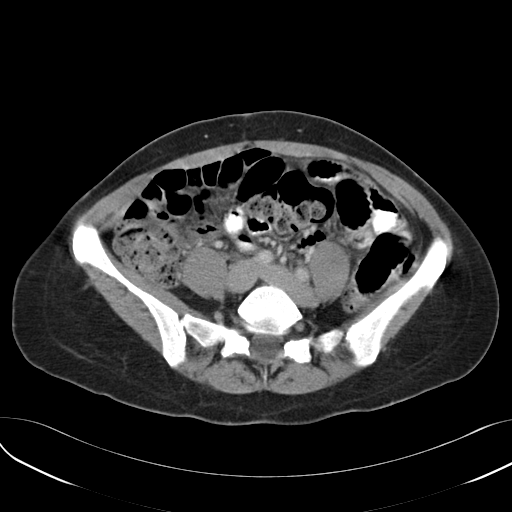
[im 55/82  soft-tissue]
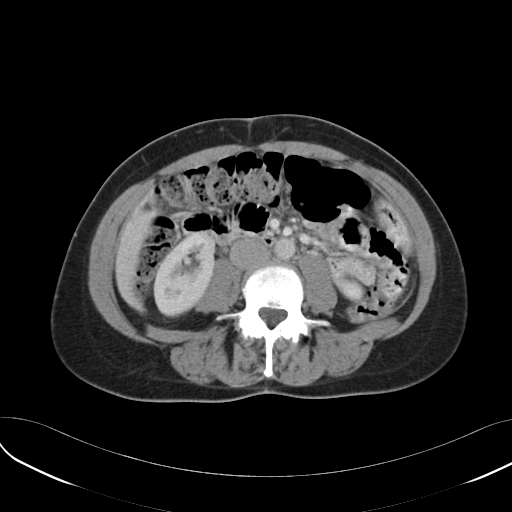
[im 55/82  bone]
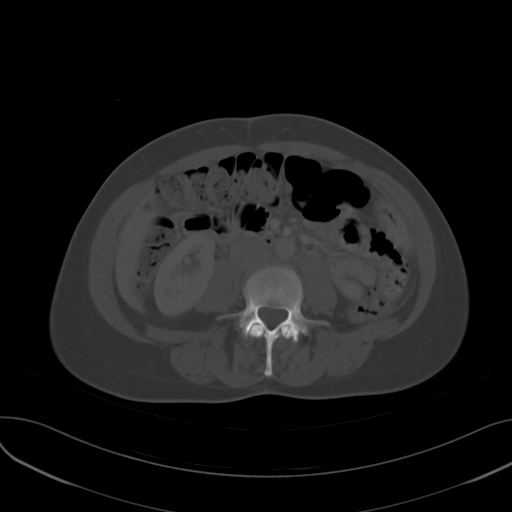
[im 59/82  soft-tissue]
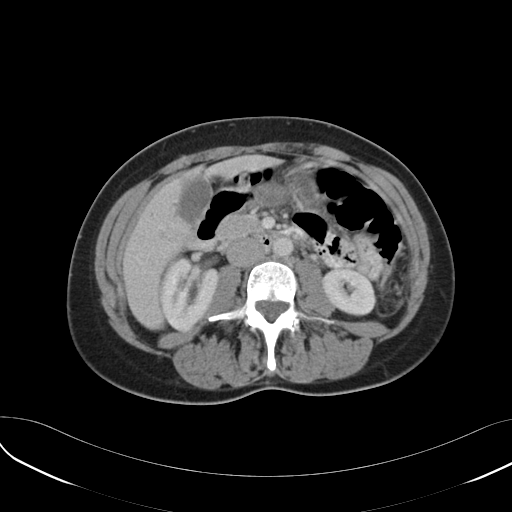
[im 64/82  soft-tissue]
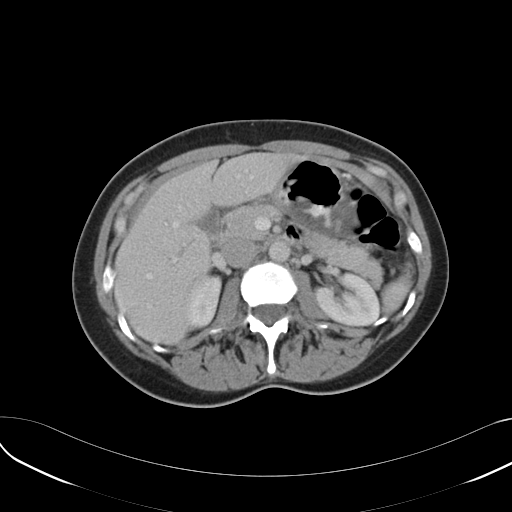
[im 64/82  lung]
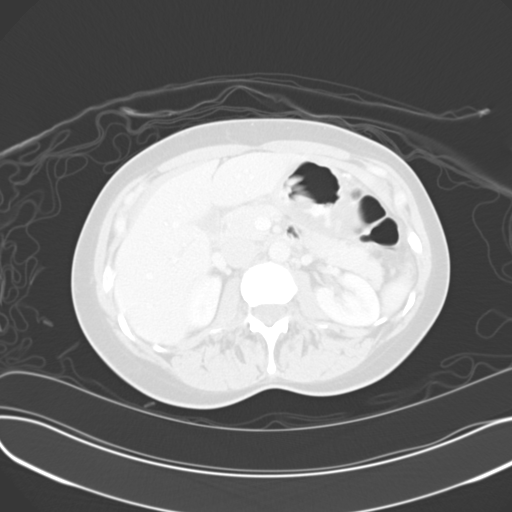
[im 68/82  lung]
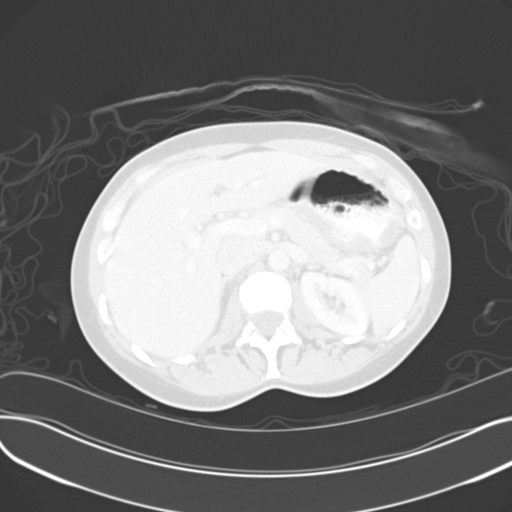
[im 73/82  soft-tissue]
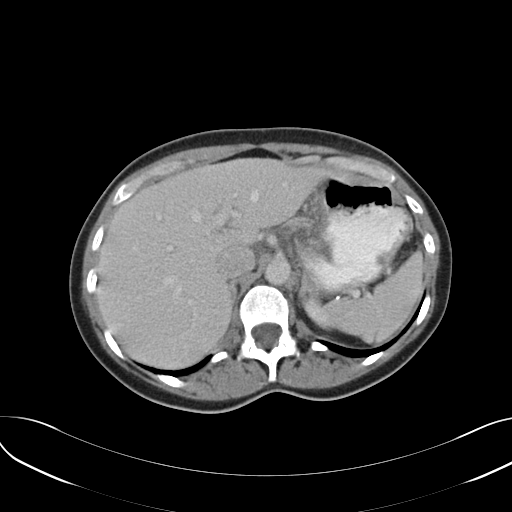
[im 73/82  lung]
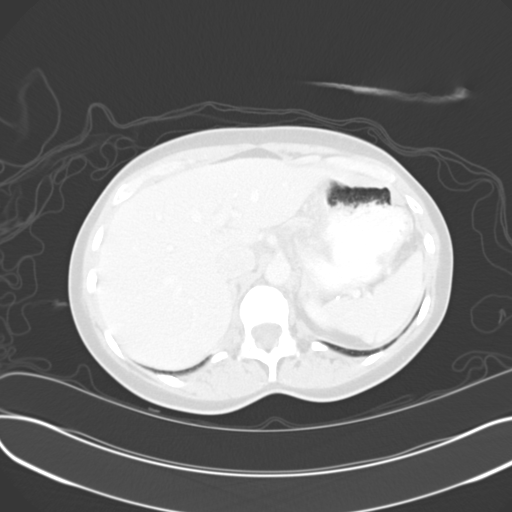
[im 77/82  soft-tissue]
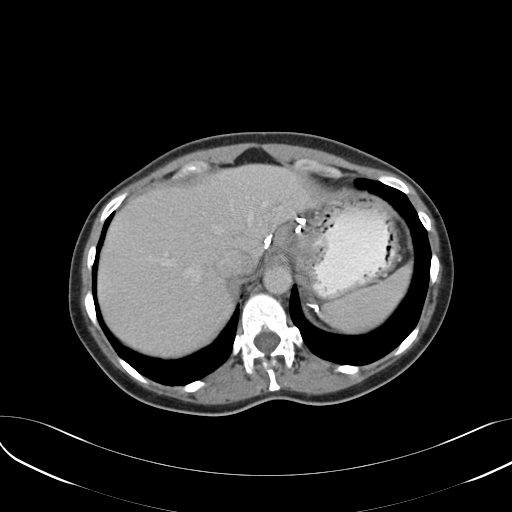
[im 77/82  lung]
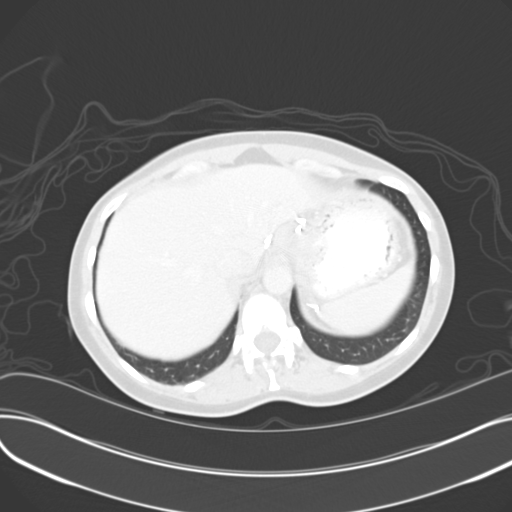

[14 of 32 positions shown; findings below may reference images not displayed]

FINDINGS: Lower chest:  No significant abnormality

Hepatobiliary: There are normal appearances of the liver,
gallbladder and bile ducts.

Pancreas: Normal

Spleen: Normal

Adrenals/Urinary Tract: The adrenals and kidneys are normal in
appearance. There is no urinary calculus evident. There is no
hydronephrosis or ureteral dilatation. Collecting systems and
ureters appear unremarkable.

Stomach/Bowel: Prior fundoplication. Prominent mural thickening of
the stomach is indeterminate but could represent gastritis. Small
bowel and colon are unremarkable. No bowel obstruction. No
extraluminal air.

Vascular/Lymphatic: The abdominal aorta is normal in caliber. There
is no atherosclerotic calcification. There is no adenopathy in the
abdomen or pelvis.

Reproductive: Unremarkable. 1.3 cm left adnexal cyst, most likely
physiologic.

Other: Small volume free pelvic fluid.

Musculoskeletal: No significant abnormality. Moderate degenerative
disc changes are present at L4-5
IMPRESSION: 1. Prominent gastric mural thickening, indeterminate. This could
represent gastritis. Neoplasm less likely but not entirely excluded.
2. Unremarkable appearances of the fundoplication.
3. Small volume free pelvic fluid, nonspecific.

## 2016-02-04 ENCOUNTER — Encounter: Payer: Self-pay | Admitting: Family Medicine

## 2016-02-04 ENCOUNTER — Ambulatory Visit (INDEPENDENT_AMBULATORY_CARE_PROVIDER_SITE_OTHER): Payer: BC Managed Care – PPO | Admitting: Family Medicine

## 2016-02-04 VITALS — BP 119/74 | HR 75 | Temp 97.6°F | Wt 140.2 lb

## 2016-02-04 DIAGNOSIS — M999 Biomechanical lesion, unspecified: Secondary | ICD-10-CM | POA: Diagnosis not present

## 2016-02-04 DIAGNOSIS — M9904 Segmental and somatic dysfunction of sacral region: Secondary | ICD-10-CM

## 2016-02-04 DIAGNOSIS — M7631 Iliotibial band syndrome, right leg: Secondary | ICD-10-CM

## 2016-02-04 DIAGNOSIS — M9903 Segmental and somatic dysfunction of lumbar region: Secondary | ICD-10-CM | POA: Diagnosis not present

## 2016-02-04 DIAGNOSIS — M9905 Segmental and somatic dysfunction of pelvic region: Secondary | ICD-10-CM | POA: Diagnosis not present

## 2016-02-04 NOTE — Progress Notes (Signed)
BP 119/74 (BP Location: Right Arm, Patient Position: Sitting, Cuff Size: Normal)   Pulse 75   Temp 97.6 F (36.4 C)   Wt 140 lb 3.2 oz (63.6 kg)   LMP 07/26/2010 (Approximate)   SpO2 97%   BMI 25.40 kg/m    Subjective:    Patient ID: Sabrina Baxter, female    DOB: [birth year], 50 y.o.   MRN: [id number]  HPI: Sabrina Baxter is a 50 y.o. female  Chief Complaint  Patient presents with  . OMM   Sabrina Baxter presents today for follow up on her IT band. She notes that she is feeling much better. Her pain went away after last visit and she was able to hike pain free. She notes that a few days after that she had a very sharp pain in her R hip that lasted a few minutes, and then went away again. Better with foam rolling, diclofenac and OMT. Worse with unknown. Radiates to her knee. She is otherwise doing well with no other concerns or complaints at this time.   Relevant past medical, surgical, family and social history reviewed and updated as indicated. Interim medical history since our last visit reviewed. Allergies and medications reviewed and updated.  Review of Systems  Constitutional: Negative.   Respiratory: Negative.   Cardiovascular: Negative.   Musculoskeletal: Positive for myalgias. Negative for arthralgias, back pain, gait problem, joint swelling, neck pain and neck stiffness.  Neurological: Negative.   Psychiatric/Behavioral: Negative.     Per HPI unless specifically indicated above     Objective:    BP 119/74 (BP Location: Right Arm, Patient Position: Sitting, Cuff Size: Normal)   Pulse 75   Temp 97.6 F (36.4 C)   Wt 140 lb 3.2 oz (63.6 kg)   LMP 07/26/2010 (Approximate)   SpO2 97%   BMI 25.40 kg/m   Wt Readings from Last 3 Encounters:  02/04/16 140 lb 3.2 oz (63.6 kg)  01/14/16 141 lb 4.8 oz (64.1 kg)  03/07/15 136 lb (61.7 kg)    Physical Exam  Constitutional: She is oriented to person, place, and time. She appears well-developed and well-nourished. No  distress.  HENT:  Head: Normocephalic and atraumatic.  Right Ear: Hearing normal.  Left Ear: Hearing normal.  Nose: Nose normal.  Eyes: Conjunctivae and lids are normal. Right eye exhibits no discharge. Left eye exhibits no discharge. No scleral icterus.  Pulmonary/Chest: Effort normal. No respiratory distress.  Abdominal: Soft. She exhibits no distension and no mass. There is no tenderness. There is no rebound and no guarding.  Neurological: She is alert and oriented to person, place, and time.  Skin: Skin is warm, dry and intact. No rash noted. No erythema. No pallor.  Psychiatric: She has a normal mood and affect. Her speech is normal and behavior is normal. Judgment and thought content normal. Cognition and memory are normal.  Nursing note and vitals reviewed. Musculoskeletal:  Exam found Decreased ROM, Tissue texture changes, Tenderness to palpation and Asymmetry of patient's  lumbar, pelvis, sacrum, lower extremity and abdomen Osteopathic Structural Exam:   Lumbar: QL hypertonic on the R, L4-5SLRR  Pelvis: Anterior R innominate  Sacrum: L on L torsion  Lower Extremity: IT band hypertonic on the R, Posterior R fibular head, glut spasm on the R  Abdomen: pelvic diaphragm restricted into R hip   Results for orders placed or performed in visit on 02/21/15  HM COLONOSCOPY  Result Value Ref Range   HM Colonoscopy Repeat Colon 10 years  EGD  Result Value Ref Range   EGD        Assessment & Plan:   Problem List Items Addressed This Visit    None    Visit Diagnoses    It band syndrome, right    -  Primary   Doing significantly better. Will look for dietary issues. Call with any concerns. Continue stretching and foam rolling.    Segmental and somatic dysfunction of sacral region       Segmental dysfunction of pelvic region       Nonallopathic lesion of lower extremities       Nonallopathic lesion of abdomen       Somatic dysfunction of spine, lumbar        After verbal  consent was obtained, patient was treated today with osteopathic manipulative medicine to the regions of the lumbar, pelvis, sacrum, abdomen and lower extremity using the techniques of myofascial release, counterstrain, muscle energy and soft tissue. Areas of compensation relating to her primary pain source also treated. Patient tolerated the procedure well with good objective and good subjective improvement in symptoms. She left the room in good condition. She was advised to stay well hydrated and that she may have some soreness following the procedure. If not improving or worsening, she will call and come in. She will return for reevaluation  on a PRN basis.    Follow up plan: Return if symptoms worsen or fail to improve.

## 2016-03-24 ENCOUNTER — Other Ambulatory Visit: Payer: Self-pay | Admitting: Family Medicine

## 2016-03-24 DIAGNOSIS — Z1231 Encounter for screening mammogram for malignant neoplasm of breast: Secondary | ICD-10-CM

## 2016-04-09 ENCOUNTER — Ambulatory Visit
Admission: RE | Admit: 2016-04-09 | Discharge: 2016-04-09 | Disposition: A | Discharge status: Home / Self Care | Payer: BC Managed Care – PPO | Source: Ambulatory Visit | Attending: Family Medicine | Admitting: Family Medicine

## 2016-04-09 ENCOUNTER — Other Ambulatory Visit: Payer: Self-pay | Admitting: Family Medicine

## 2016-04-09 DIAGNOSIS — Z1231 Encounter for screening mammogram for malignant neoplasm of breast: Secondary | ICD-10-CM | POA: Insufficient documentation

## 2016-04-10 ENCOUNTER — Other Ambulatory Visit: Payer: Self-pay | Admitting: Family Medicine

## 2016-04-10 DIAGNOSIS — R928 Other abnormal and inconclusive findings on diagnostic imaging of breast: Secondary | ICD-10-CM

## 2016-04-10 DIAGNOSIS — N6310 Unspecified lump in the right breast, unspecified quadrant: Secondary | ICD-10-CM

## 2016-04-16 IMAGING — MG MM DIGITAL SCREENING BILAT W/ CAD
1 series · 4 of 4 positions shown · non-contrast
Comparison: Previous exam(s).

CLINICAL DATA: Screening.

EXAM:
DIGITAL SCREENING BILATERAL MAMMOGRAM WITH CAD

[R CC · right · 4 of 4 slices shown]
[im 1/4]
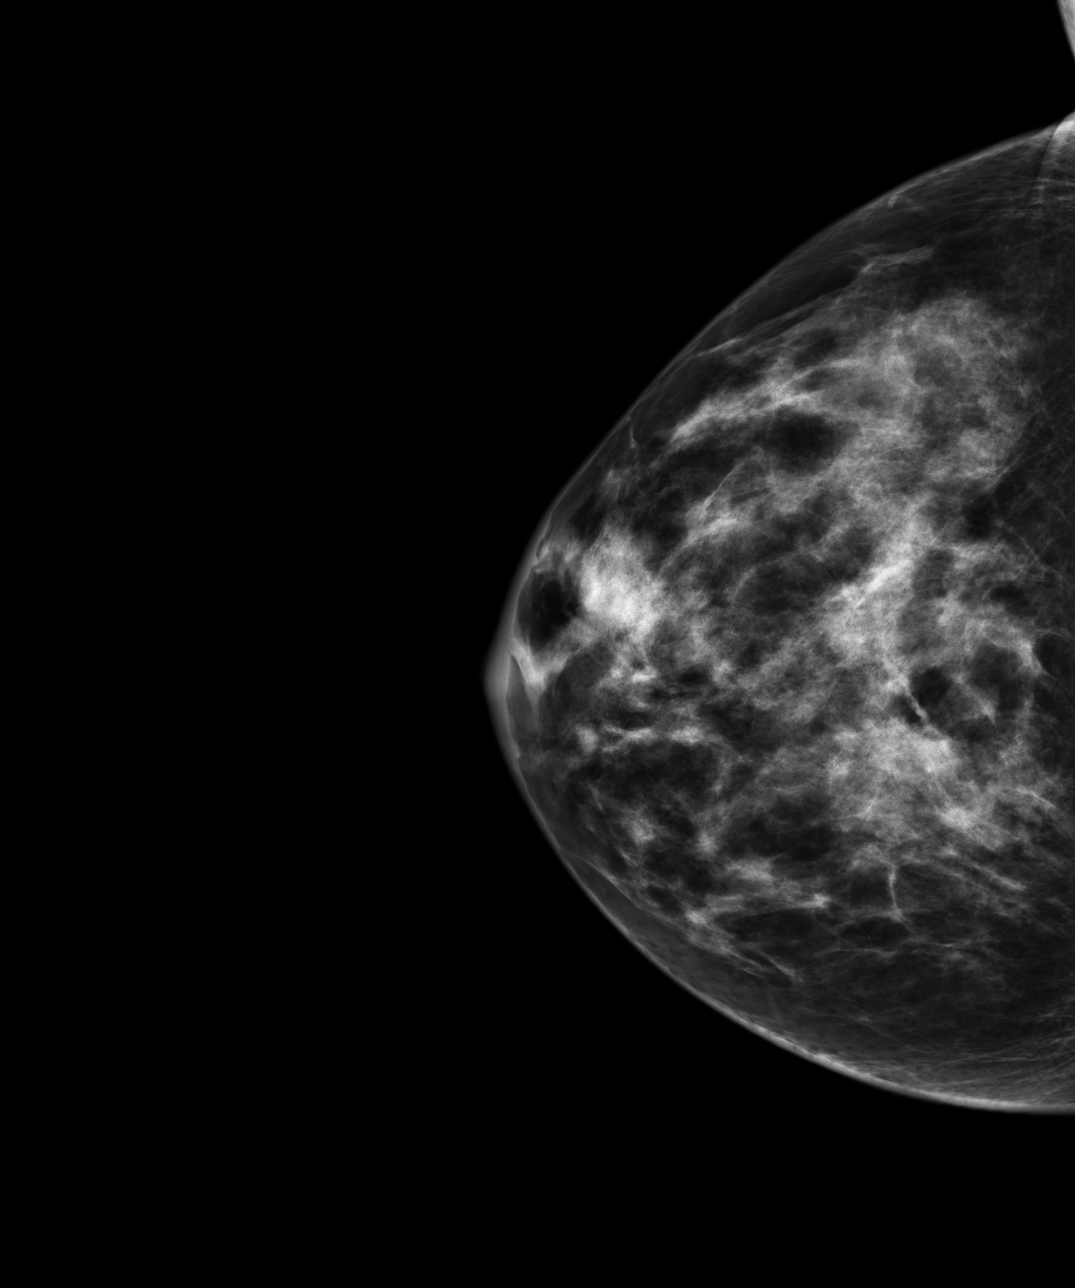
[im 2/4]
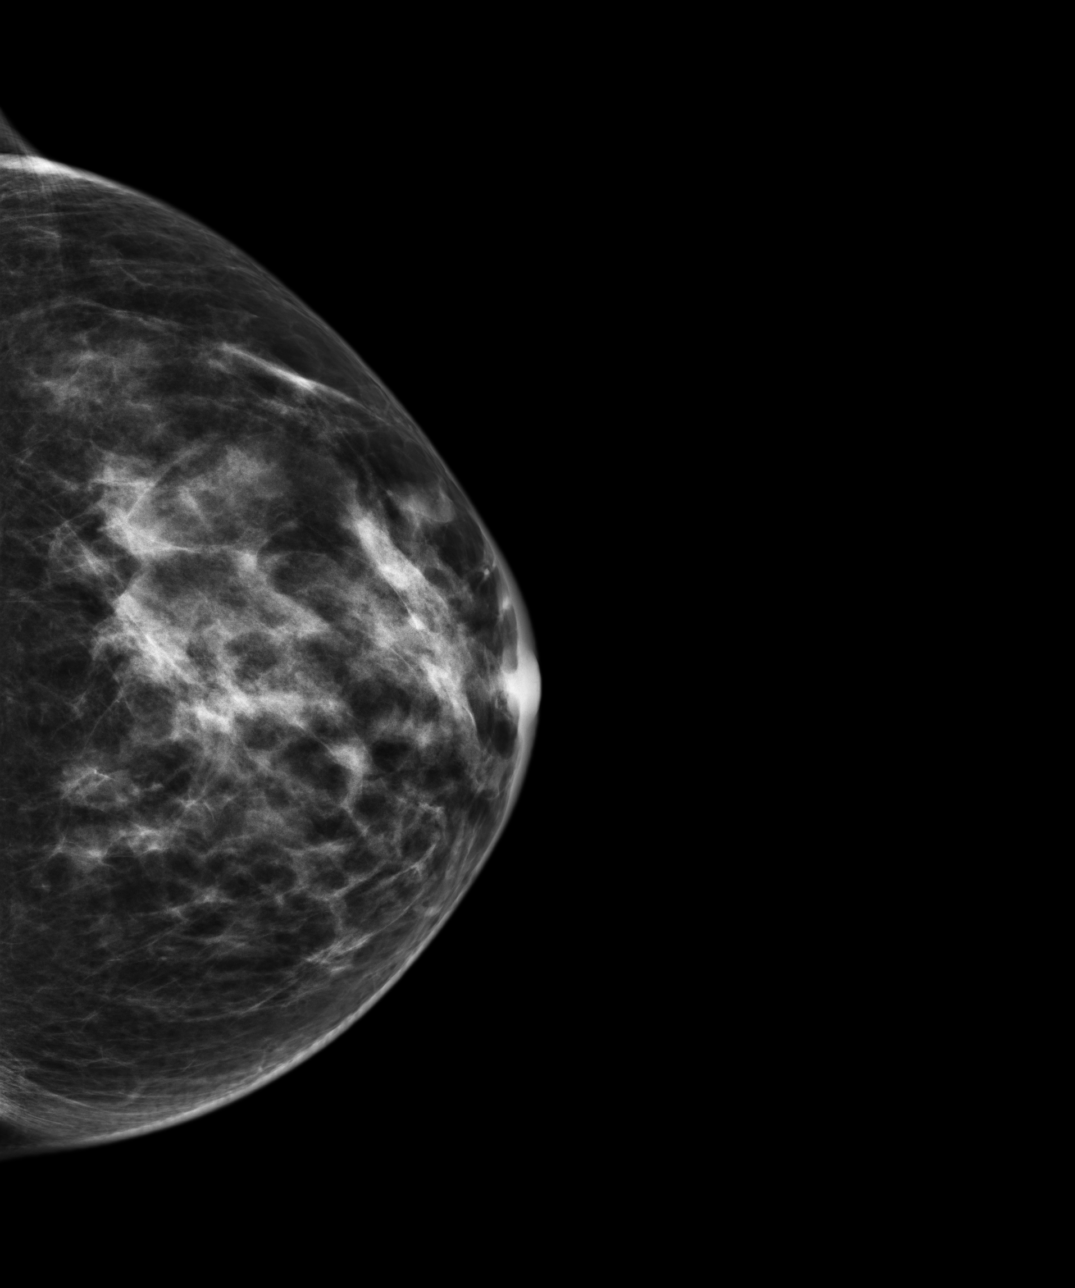
[im 3/4]
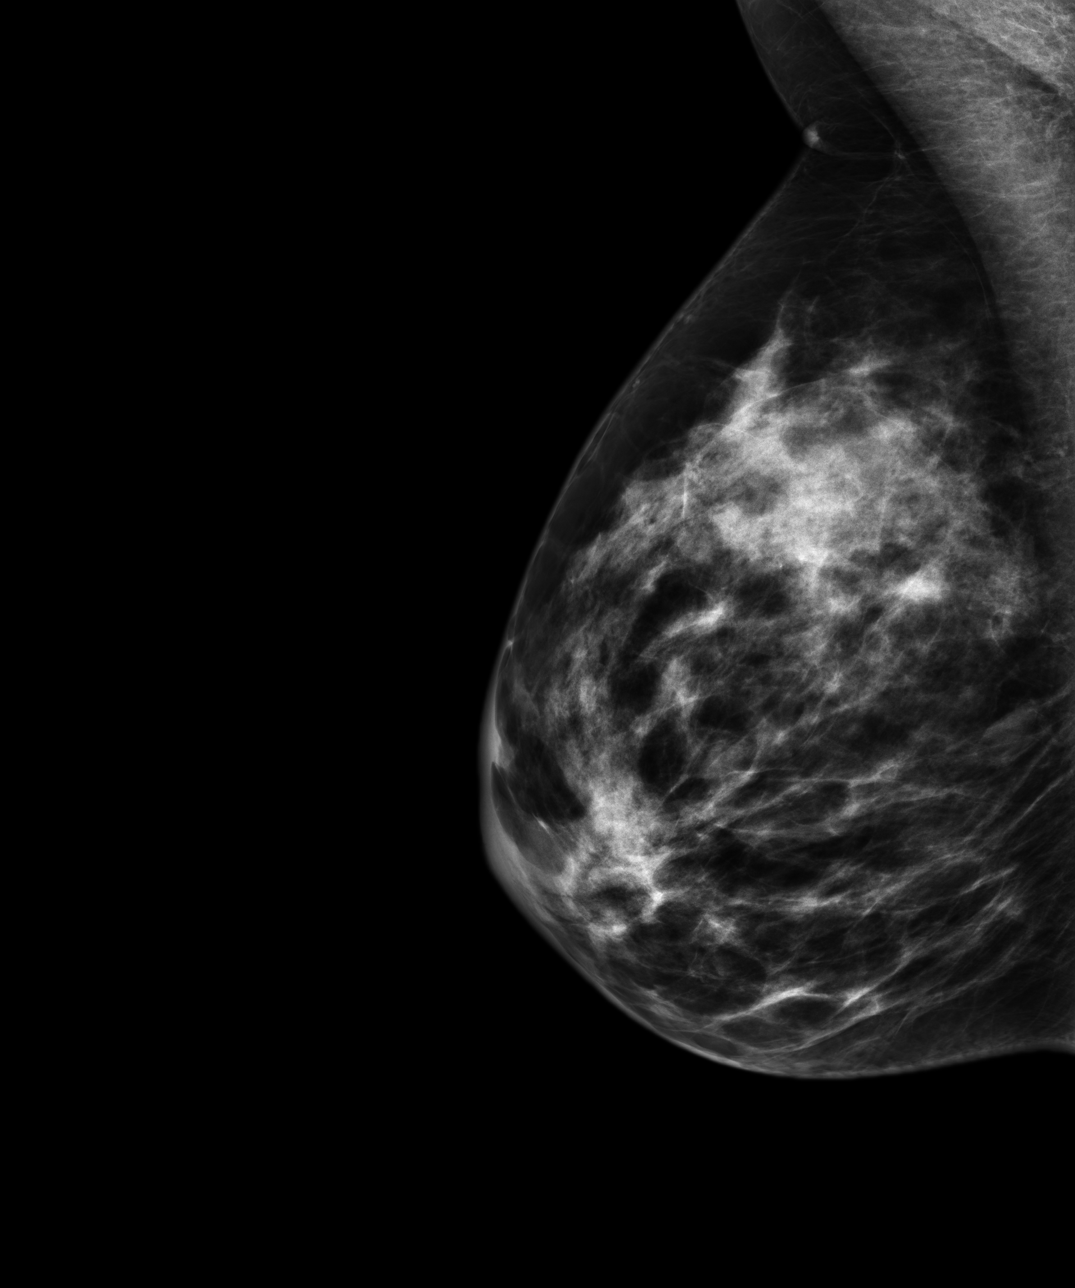
[im 4/4]
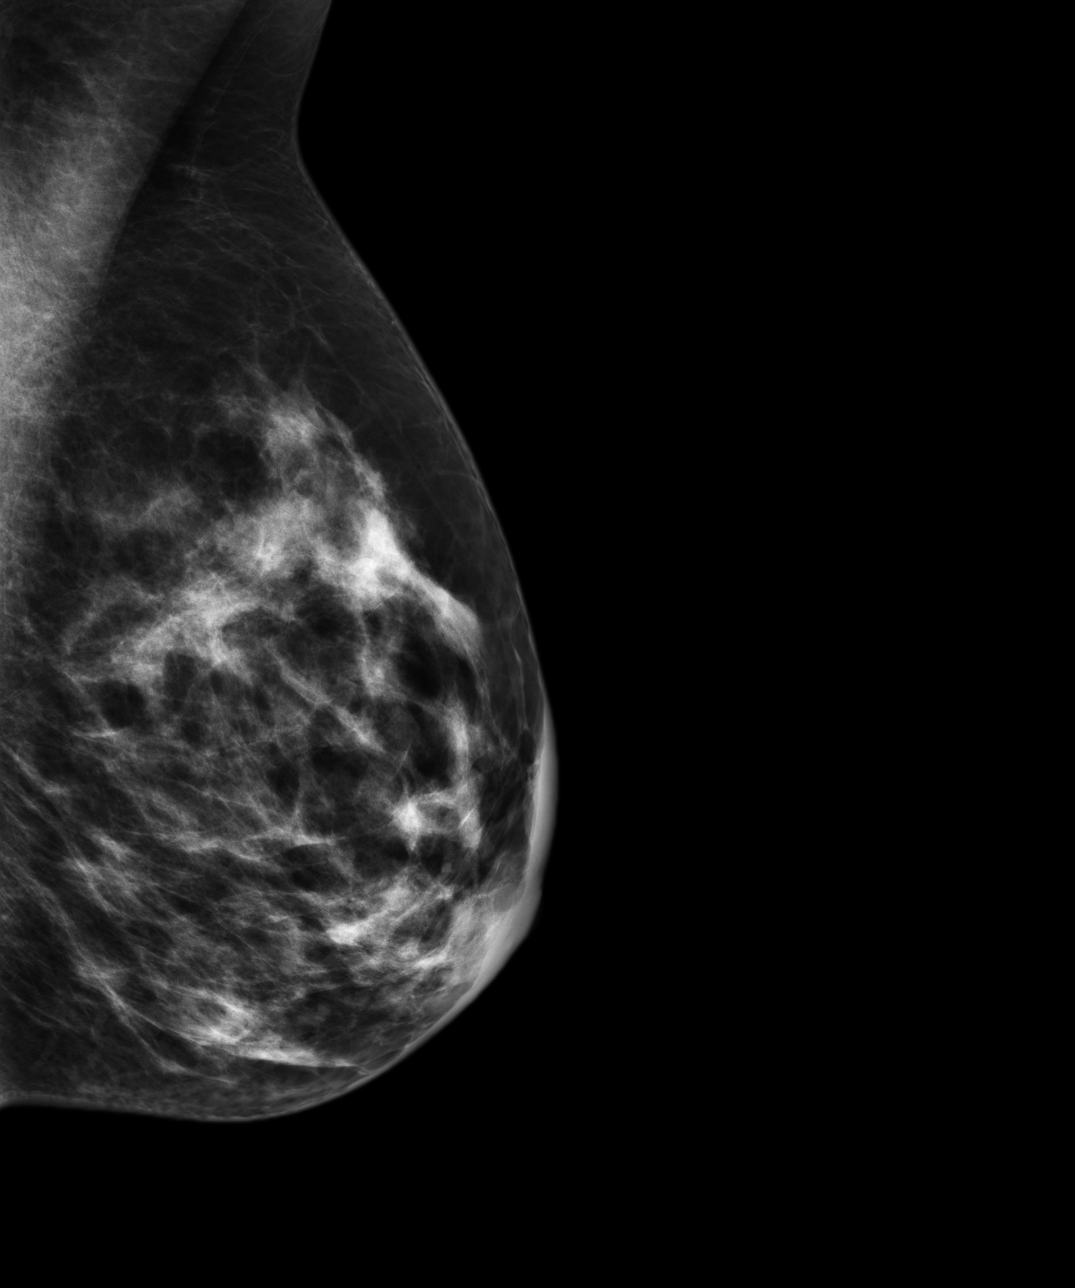

[4 of 4 positions shown; findings below may reference images not displayed]

ACR Breast Density Category c: The breast tissue is heterogeneously
dense, which may obscure small masses.
FINDINGS: There are no findings suspicious for malignancy. Images were
processed with CAD.
IMPRESSION: No mammographic evidence of malignancy. A result letter of this
screening mammogram will be mailed directly to the patient.

RECOMMENDATION:
Screening mammogram in one year. (Code:YJ-2-FEZ)

BI-RADS CATEGORY  1: Negative.

## 2016-04-28 ENCOUNTER — Ambulatory Visit: Payer: BC Managed Care – PPO

## 2016-04-28 ENCOUNTER — Other Ambulatory Visit: Payer: BC Managed Care – PPO

## 2016-05-07 ENCOUNTER — Ambulatory Visit: Payer: BC Managed Care – PPO

## 2016-06-04 ENCOUNTER — Ambulatory Visit
Admission: RE | Admit: 2016-06-04 | Discharge: 2016-06-04 | Disposition: A | Discharge status: Home / Self Care | Payer: BC Managed Care – PPO | Source: Ambulatory Visit | Attending: Family Medicine | Admitting: Family Medicine

## 2016-06-04 DIAGNOSIS — R928 Other abnormal and inconclusive findings on diagnostic imaging of breast: Secondary | ICD-10-CM

## 2016-06-04 DIAGNOSIS — N6310 Unspecified lump in the right breast, unspecified quadrant: Secondary | ICD-10-CM

## 2016-08-01 ENCOUNTER — Encounter: Payer: Self-pay | Admitting: Family Medicine

## 2016-08-01 ENCOUNTER — Ambulatory Visit (INDEPENDENT_AMBULATORY_CARE_PROVIDER_SITE_OTHER): Payer: BC Managed Care – PPO | Admitting: Family Medicine

## 2016-08-01 VITALS — BP 104/72 | HR 65 | Temp 98.5°F | Wt 139.0 lb

## 2016-08-01 DIAGNOSIS — H6503 Acute serous otitis media, bilateral: Secondary | ICD-10-CM | POA: Diagnosis not present

## 2016-08-01 NOTE — Patient Instructions (Signed)
Flonase twice daily Sudafed every 6 hours for several days, then can take as needed Can also add a claritin or zyrtec daily

## 2016-08-01 NOTE — Progress Notes (Signed)
   BP 104/72   Pulse 65   Temp 98.5 F (36.9 C)   Wt 139 lb (63 kg)   LMP 07/26/2010 (Approximate)   SpO2 99%   BMI 25.18 kg/m    Subjective:    Patient ID: Sabrina Baxter, female    DOB: 02/15/1966, 51 y.o.   MRN: [id number]  HPI: Sabrina Baxter is a 51 y.o. female  Chief Complaint  Patient presents with  . Ear Pain    went to [facility] last week, was given Zpack and still having issues with bilateral ears. ear pain/pressure, hearing is muffled, buzzed. Other head/sinus sxs has resolved.    Patient presents with persistent ear pressure and muffled hearing after finishing a zpack for a sinus infection. Left side worse than right. Good resolution of all other sxs. Denies fever, chills, facial pain, cough. Not currently taking anything OTC for sxs.   Relevant past medical, surgical, family and social history reviewed and updated as indicated. Interim medical history since our last visit reviewed. Allergies and medications reviewed and updated.  Review of Systems  Constitutional: Negative.   HENT: Positive for ear pain and hearing loss (muffled).   Eyes: Negative.   Respiratory: Negative.   Cardiovascular: Negative.   Gastrointestinal: Negative.   Genitourinary: Negative.   Musculoskeletal: Negative.   Neurological: Negative.   Psychiatric/Behavioral: Negative.     Per HPI unless specifically indicated above     Objective:    BP 104/72   Pulse 65   Temp 98.5 F (36.9 C)   Wt 139 lb (63 kg)   LMP 07/26/2010 (Approximate)   SpO2 99%   BMI 25.18 kg/m   Wt Readings from Last 3 Encounters:  08/01/16 139 lb (63 kg)  02/04/16 140 lb 3.2 oz (63.6 kg)  01/14/16 141 lb 4.8 oz (64.1 kg)    Physical Exam  Constitutional: She is oriented to person, place, and time. She appears well-developed and well-nourished. No distress.  HENT:  Head: Atraumatic.  B/l middle ear effusion. No erythema or purulence  Eyes: Conjunctivae are normal. Pupils are equal, round, and reactive to light.  No scleral icterus.  Neck: Normal range of motion. Neck supple.  Cardiovascular: Normal rate and normal heart sounds.   Pulmonary/Chest: Effort normal and breath sounds normal. No respiratory distress.  Musculoskeletal: Normal range of motion.  Neurological: She is alert and oriented to person, place, and time.  Skin: Skin is warm and dry.  Psychiatric: She has a normal mood and affect. Her behavior is normal.  Nursing note and vitals reviewed.     Assessment & Plan:   Problem List Items Addressed This Visit    None    Visit Diagnoses    Bilateral acute serous otitis media, recurrence not specified    -  Primary   Discussed flonase, sudafed, and zyrtec. Supportive care reviewed. F/u if worsening or no improvement       Follow up plan: Return if symptoms worsen or fail to improve.

## 2016-08-05 ENCOUNTER — Encounter: Payer: Self-pay | Admitting: Family Medicine

## 2016-08-06 ENCOUNTER — Ambulatory Visit (INDEPENDENT_AMBULATORY_CARE_PROVIDER_SITE_OTHER): Payer: BC Managed Care – PPO | Admitting: Family Medicine

## 2016-08-06 ENCOUNTER — Encounter: Payer: Self-pay | Admitting: Family Medicine

## 2016-08-06 VITALS — BP 107/73 | HR 61 | Temp 98.5°F | Wt 141.0 lb

## 2016-08-06 DIAGNOSIS — H66002 Acute suppurative otitis media without spontaneous rupture of ear drum, left ear: Secondary | ICD-10-CM

## 2016-08-06 MED ORDER — PREDNISONE 10 MG PO TABS: ORAL_TABLET | ORAL | 0 refills | Status: DC

## 2016-08-06 MED ORDER — DOXYCYCLINE HYCLATE 100 MG PO TABS: 100.0000 mg | ORAL_TABLET | Freq: Two times a day (BID) | ORAL | 0 refills | Status: DC

## 2016-08-06 NOTE — Progress Notes (Signed)
   BP 107/73   Pulse 61   Temp 98.5 F (36.9 C)   Wt 141 lb (64 kg)   LMP 07/26/2010 (Approximate)   SpO2 100%   BMI 25.54 kg/m    Subjective:    Patient ID: Sabrina Baxter, female    DOB: 1965-04-05, 51 y.o.   MRN: [id number]  HPI: Sabrina Baxter is a 51 y.o. female  Chief Complaint  Patient presents with  . Ear Pain    left ear is getting worse and feels like the right is too. Wanted to have them rechecked.    Patient here following up on left ear pain. Has been taking sudafed, flonase, and zyrtec as recommended but pain has been worsening. Throbbing, aching pain, pressure, muffled hearing, soreness around ear. Denies fever, chills, discharge from ear, persistent congestion or rhinorrhea.   Relevant past medical, surgical, family and social history reviewed and updated as indicated. Interim medical history since our last visit reviewed. Allergies and medications reviewed and updated.  Review of Systems  Constitutional: Negative.   HENT: Positive for ear pain and hearing loss (muffled).   Respiratory: Negative.   Cardiovascular: Negative.   Gastrointestinal: Negative.   Genitourinary: Negative.   Musculoskeletal: Negative.   Neurological: Negative.   Psychiatric/Behavioral: Negative.     Per HPI unless specifically indicated above     Objective:    BP 107/73   Pulse 61   Temp 98.5 F (36.9 C)   Wt 141 lb (64 kg)   LMP 07/26/2010 (Approximate)   SpO2 100%   BMI 25.54 kg/m   Wt Readings from Last 3 Encounters:  08/06/16 141 lb (64 kg)  08/01/16 139 lb (63 kg)  02/04/16 140 lb 3.2 oz (63.6 kg)    Physical Exam  Constitutional: She is oriented to person, place, and time. She appears well-developed and well-nourished. No distress.  HENT:  Head: Atraumatic.  Right Ear: External ear normal.  Left Ear: External ear normal.  Nose: Nose normal.  Mouth/Throat: Oropharynx is clear and moist. No oropharyngeal exudate.  Left TM bulging, erythematous, with significant  purulent effusion  Eyes: Conjunctivae are normal. Pupils are equal, round, and reactive to light.  Neck: Normal range of motion. Neck supple.  Cardiovascular: Normal rate and normal heart sounds.   Pulmonary/Chest: Effort normal and breath sounds normal. No respiratory distress.  Musculoskeletal: Normal range of motion.  Neurological: She is alert and oriented to person, place, and time.  Skin: Skin is warm and dry.  Psychiatric: She has a normal mood and affect. Her behavior is normal.  Nursing note and vitals reviewed.     Assessment & Plan:   Problem List Items Addressed This Visit    None    Visit Diagnoses    Acute suppurative otitis media of left ear without spontaneous rupture of tympanic membrane, recurrence not specified    -  Primary   Will treat with doxycycline and prednisone taper. Continue zyrtec, sudafed, flonase. F/u if worsening or no improvement   Relevant Medications   doxycycline (VIBRA-TABS) 100 MG tablet       Follow up plan: Return if symptoms worsen or fail to improve.

## 2016-10-17 ENCOUNTER — Other Ambulatory Visit: Payer: Self-pay | Admitting: Family Medicine

## 2016-11-19 ENCOUNTER — Telehealth: Payer: Self-pay | Admitting: Family Medicine

## 2016-11-19 NOTE — Telephone Encounter (Signed)
Patient requesting referral [facility] located at [facility]. Patient needing to complete her 6 month follow-up with center due to abnormal findings at her last visit.  Please Advise.  Thank you

## 2016-11-20 ENCOUNTER — Other Ambulatory Visit: Payer: Self-pay | Admitting: Family Medicine

## 2016-11-20 DIAGNOSIS — R928 Other abnormal and inconclusive findings on diagnostic imaging of breast: Secondary | ICD-10-CM

## 2016-11-20 NOTE — Telephone Encounter (Signed)
Patient notified

## 2016-11-20 NOTE — Telephone Encounter (Signed)
Patient just needs an order and then can call and schedule appointment. Dr. Wynetta Emery, will you enter in an order for patient?

## 2016-11-20 NOTE — Telephone Encounter (Signed)
Orders in. Thanks!

## 2016-12-11 ENCOUNTER — Telehealth: Payer: Self-pay | Admitting: Family Medicine

## 2016-12-11 DIAGNOSIS — R928 Other abnormal and inconclusive findings on diagnostic imaging of breast: Secondary | ICD-10-CM

## 2016-12-11 NOTE — Telephone Encounter (Signed)
-----   Message from Marlin sent at 12/10/2016 11:46 AM EDT ----- Regarding: ORDER Please enter the following order for this patient,  [id number] _ Diag Rt TOMO mammogram  Thank you.

## 2016-12-17 ENCOUNTER — Ambulatory Visit: Payer: BC Managed Care – PPO | Admitting: Family Medicine

## 2016-12-24 ENCOUNTER — Encounter: Payer: Self-pay | Admitting: Family Medicine

## 2016-12-24 ENCOUNTER — Ambulatory Visit (INDEPENDENT_AMBULATORY_CARE_PROVIDER_SITE_OTHER): Payer: BC Managed Care – PPO | Admitting: Family Medicine

## 2016-12-24 VITALS — BP 115/77 | HR 75 | Temp 98.8°F | Wt 141.4 lb

## 2016-12-24 DIAGNOSIS — M9904 Segmental and somatic dysfunction of sacral region: Secondary | ICD-10-CM | POA: Diagnosis not present

## 2016-12-24 DIAGNOSIS — M9905 Segmental and somatic dysfunction of pelvic region: Secondary | ICD-10-CM

## 2016-12-24 DIAGNOSIS — N9489 Other specified conditions associated with female genital organs and menstrual cycle: Secondary | ICD-10-CM

## 2016-12-24 DIAGNOSIS — N951 Menopausal and female climacteric states: Secondary | ICD-10-CM

## 2016-12-24 DIAGNOSIS — M9903 Segmental and somatic dysfunction of lumbar region: Secondary | ICD-10-CM | POA: Diagnosis not present

## 2016-12-24 DIAGNOSIS — M999 Biomechanical lesion, unspecified: Secondary | ICD-10-CM | POA: Diagnosis not present

## 2016-12-24 NOTE — Progress Notes (Signed)
BP 115/77 (BP Location: Left Arm, Patient Position: Sitting, Cuff Size: Normal)   Pulse 75   Temp 98.8 F (37.1 C)   Wt 141 lb 6 oz (64.1 kg)   LMP 07/26/2010 (Approximate)   SpO2 99%   BMI 25.61 kg/m    Subjective:    Patient ID: Sabrina Baxter, female    DOB: Jun 04, 1965, 51 y.o.   MRN: [id number]  HPI: Sabrina Baxter is a 51 y.o. female  Chief Complaint  Patient presents with  . Pelvic Pain   Ariahna presents today as her pelvis is acting up again. She notes that in general, she has been doing better. Able to sit more comfortably. She is still not on her bike again, but is not in the discomfort that she was in for so long. She does note that her IT band is acting up again R>L Feels like her L leg is tingling and falling asleep when she is sitting at night. She feels like everything is not where it's supposed to be and that her pelvis is off. She notes that OMT tends to help this and would like to try again. Better with OMT, worse with certain positions. Pain is in her lower pelvis and radiates into her L hip. Aching and sore in nature. She is otherwise feeling well.    MENOPAUSAL SYMPTOMS Duration: worse Symptom severity: severe Hot flashes: yes Night sweats: yes Sleep disturbances: yes Vaginal dryness: no Dyspareunia:no Decreased libido: no Emotional lability: no Stress incontinence: no Previous HRT/pharmacotherapy: no Hysterectomy: yes Absolute Contraindications to Hormonal Therapy:     Undiagnosed vaginal bleeding: no    Breast cancer: no    Endometrial cancer: no    Coronary disease: no    Cerebrovascular disease: no    Venous thromboembolic disease: no  Relevant past medical, surgical, family and social history reviewed and updated as indicated. Interim medical history since our last visit reviewed. Allergies and medications reviewed and updated.  Review of Systems  Constitutional: Negative.   Respiratory: Negative.   Cardiovascular: Negative.     Musculoskeletal: Positive for arthralgias and myalgias. Negative for back pain, gait problem, joint swelling, neck pain and neck stiffness.  Neurological: Negative.   Psychiatric/Behavioral: Negative.     Per HPI unless specifically indicated above     Objective:    BP 115/77 (BP Location: Left Arm, Patient Position: Sitting, Cuff Size: Normal)   Pulse 75   Temp 98.8 F (37.1 C)   Wt 141 lb 6 oz (64.1 kg)   LMP 07/26/2010 (Approximate)   SpO2 99%   BMI 25.61 kg/m   Wt Readings from Last 3 Encounters:  12/24/16 141 lb 6 oz (64.1 kg)  08/06/16 141 lb (64 kg)  08/01/16 139 lb (63 kg)    Physical Exam  Constitutional: She is oriented to person, place, and time. She appears well-developed and well-nourished. No distress.  HENT:  Head: Normocephalic and atraumatic.  Right Ear: Hearing normal.  Left Ear: Hearing normal.  Nose: Nose normal.  Eyes: Conjunctivae and lids are normal. Right eye exhibits no discharge. Left eye exhibits no discharge. No scleral icterus.  Cardiovascular: Normal rate, regular rhythm, normal heart sounds and intact distal pulses.  Exam reveals no gallop and no friction rub.   No murmur heard. Pulmonary/Chest: Effort normal and breath sounds normal. No respiratory distress. She has no wheezes. She has no rales. She exhibits no tenderness.  Abdominal: Soft. She exhibits no distension and no mass. There is no tenderness. There is  no rebound and no guarding.  Neurological: She is alert and oriented to person, place, and time.  Skin: Skin is warm, dry and intact. No rash noted. She is not diaphoretic. No erythema. No pallor.  Psychiatric: She has a normal mood and affect. Her speech is normal and behavior is normal. Judgment and thought content normal. Cognition and memory are normal.  Nursing note and vitals reviewed. Musculoskeletal:  Exam found Decreased ROM, Tissue texture changes, Tenderness to palpation and Asymmetry of patient's  lumbar, pelvis, sacrum,  lower extremity and abdomen Osteopathic Structural Exam:   Lumbar: QL hypertonic bilaterally R>L, L3-4SLRR  Pelvis: Anterior L innominate, SI joint restricted on the L  Sacrum: L on L torsion  Lower Extremity: IT band hypertonic on the R, R glut spasm, R quad hypertonic  Abdomen: pelvic diaphragm pulled into L hip   Results for orders placed or performed in visit on 02/21/15  HM COLONOSCOPY  Result Value Ref Range   HM Colonoscopy Repeat Colon 10 years   EGD  Result Value Ref Range   EGD        Assessment & Plan:   Problem List Items Addressed This Visit      Genitourinary   Pelvic pain syndrome - Primary    In slight exacerbation. She complains of neuropathic pain but she cannot tolerate neuroleptics. 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 May benefit from sclerotherapy for scar tissue from GYN procedures. That she is better from her flare after OMT is encouraging. Follow up PRN. She does have some functional and myofascial issues that may benefit from OMT. She was treated today as discussed below.              Other Visit Diagnoses    Somatic dysfunction of spine, lumbar       Segmental and somatic dysfunction of sacral region       Segmental dysfunction of pelvic region       Nonallopathic lesion of lower extremities       Nonallopathic lesion of abdomen       Menopausal syndrome       Will consider medication- information provided to patient. Call with what she'd like to do.      After verbal consent was obtained, patient was treated today with osteopathic manipulative medicine to the regions of the lumbar, pelvis, sacrum, abdomen and lower extremity using the techniques of myofascial release, counterstrain, muscle energy and soft tissue. Areas of compensation relating to her primary pain source also treated. Patient tolerated the procedure well with good objective and good subjective improvement in symptoms. She left the room in good condition. She was  advised to stay well hydrated and that she may have some soreness following the procedure. If not improving or worsening, she will call and come in. She will return for reevaluation  on a PRN basis.   Follow up plan: Return if symptoms worsen or fail to improve.

## 2016-12-24 NOTE — Assessment & Plan Note (Signed)
In slight exacerbation. She complains of neuropathic pain but she cannot tolerate neuroleptics. 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 May benefit from sclerotherapy for scar tissue from GYN procedures. That she is better from her flare after OMT is encouraging. Follow up PRN. She does have some functional and myofascial issues that may benefit from OMT. She was treated today as discussed below.

## 2016-12-24 NOTE — Patient Instructions (Addendum)
Menopause and Herbal Products What is menopause? Menopause is the normal time of life when menstrual periods decrease in frequency and eventually stop completely. This process can take several years for some women. Menopause is complete when you have had an absence of menstruation for a full year since your last menstrual period. It usually occurs between the ages of 71 and 52. It is not common for menopause to begin before the age of 75. During menopause, your body stops producing the female hormones estrogen and progesterone. Common symptoms associated with this loss of hormones (vasomotor symptoms) are:  Hot flashes.  Hot flushes.  Night sweats.  Other common symptoms and complications of menopause include:  Decrease in sex drive.  Vaginal dryness and thinning of the walls of the vagina. This can make sex painful.  Dryness of the skin and development of wrinkles.  Headaches.  Tiredness.  Irritability.  Memory problems.  Weight gain.  Bladder infections.  Hair growth on the face and chest.  Inability to reproduce offspring (infertility).  Loss of density in the bones (osteoporosis) increasing your risk for breaks (fractures).  Depression.  Hardening and narrowing of the arteries (atherosclerosis). This increases your risk of heart attack and stroke.  What treatment options are available? There are many treatment choices for menopause symptoms. The most common treatment is hormone replacement therapy. Many alternative therapies for menopause are emerging, including the use of herbal products. These supplements can be found in the form of herbs, teas, oils, tinctures, and pills. Common herbal supplements for menopause are made from plants that contain phytoestrogens. Phytoestrogens are compounds that occur naturally in plants and plant products. They act like estrogen in the body. Foods and herbs that contain phytoestrogens include:  Soy.  Flax seeds.  Red  clover.  Ginseng.  What menopause symptoms may be helped if I use herbal products?  Vasomotor symptoms. These may be helped by: ? Soy. Some studies show that soy may have a moderate benefit for hot flashes. ? Black cohosh. There is limited evidence indicating this may be beneficial for hot flashes.  Symptoms that are related to heart and blood vessel disease. These may be helped by soy. Studies have shown that soy can help to lower cholesterol.  Evening Primrose Oil  Depression. This may be helped by: ? St. John's wort. There is limited evidence that shows this may help mild to moderate depression. ? Black cohosh. There is evidence that this may help depression and mood swings.  Osteoporosis. Soy may help to decrease bone loss that is associated with menopause and may prevent osteoporosis. Limited evidence indicates that red clover may offer some bone loss protection as well. Other herbal products that are commonly used during menopause lack enough evidence to support their use as a replacement for conventional menopause therapies. These products include evening primrose, ginseng, and red clover. What are the cases when herbal products should not be used during menopause? Do not use herbal products during menopause without your health care provider's approval if:  You are taking medicine.  You have a preexisting liver condition.  Are there any risks in my taking herbal products during menopause? If you choose to use herbal products to help with symptoms of menopause, keep in mind that:  Different supplements have different and unmeasured amounts of herbal ingredients.  Herbal products are not regulated the same way that medicines are.  Concentrations of herbs may vary depending on the way they are prepared. For example, the concentration may be  different in a pill, tea, oil, and tincture.  Little is known about the risks of using herbal products, particularly the risks of  long-term use.  Some herbal supplements can be harmful when combined with certain medicines.  Most commonly reported side effects of herbal products are mild. However, if used improperly, many herbal supplements can cause serious problems. Talk to your health care provider before starting any herbal product. If problems develop, stop taking the supplement and let your health care provider know. This information is not intended to replace advice given to you by your health care provider. Make sure you discuss any questions you have with your health care provider. Document Released: 07/30/2007 Document Revised: 01/08/2016 Document Reviewed: 07/26/2013 Elsevier Interactive Patient Education  2017 [employer].  Menopause and Hormone Replacement Therapy What is hormone replacement therapy? Hormone replacement therapy (HRT) is the use of artificial (synthetic) hormones to replace hormones that your body stops producing during menopause. Menopause is the normal time of life when menstrual periods stop completely and the ovaries stop producing the female hormones estrogen and progesterone. This lack of hormones can affect your health and cause undesirable symptoms. HRT can relieve some of those symptoms. What are my options for HRT? HRT may consist of the synthetic hormones estrogen and progestin, or it may consist of only estrogen (estrogen-only therapy). You and your health care provider will decide which form of HRT is best for you. If you choose to be on HRT and you have a uterus, estrogen and progestin are usually prescribed. Estrogen-only therapy is used for women who do not have a uterus. Possible options for taking HRT include:  Pills.  Patches.  Gels.  Sprays.  Vaginal cream.  Vaginal rings.  Vaginal inserts.  The amount of hormone(s) that you take and how long you take the hormone(s) varies depending on your individual health. It is important to:  Begin HRT with the lowest  possible dosage.  Stop HRT as soon as your health care provider tells you to stop.  Work with your health care provider so that you feel informed and comfortable with your decisions.  What are the benefits of HRT? HRT can reduce the frequency and severity of menopausal symptoms. Benefits of HRT vary depending on the menopausal symptoms that you have, the severity of your symptoms, and your overall health. HRT may help to improve the following menopausal symptoms:  Hot flashes and night sweats. These are sudden feelings of heat that spread over the face and body. The skin may turn red, like a blush. Night sweats are hot flashes that happen while you are sleeping or trying to sleep.  Bone loss (osteoporosis). The body loses calcium more quickly after menopause, causing the bones to become weaker. This can increase the risk for bone breaks (fractures).  Vaginal dryness. The lining of the vagina can become thin and dry, which can cause pain during sexual intercourse or cause infection, burning, or itching.  Urinary tract infections.  Urinary incontinence. This is a decreased ability to control when you urinate.  Irritability.  Short-term memory problems.  What are the risks of HRT? Risks of HRT vary depending on your individual health and medical history. Risks of HRT also depend on whether you receive both estrogen and progestin or you receive estrogen only.HRT may increase the risk of:  Spotting. This is when a small amount of bloodleaks from the vagina unexpectedly.  Endometrial cancer. This cancer is in the lining of the uterus (endometrium).  Breast  cancer.  Increased density of breast tissue. This can make it harder to find breast cancer on a breast X-ray (mammogram).  Stroke.  Heart attack.  Blood clots.  Gallbladder disease.  Risks of HRT can increase if you have any of the following conditions:  Endometrial cancer.  Liver disease.  Heart disease.  Breast  cancer.  History of blood clots.  History of stroke.  How should I care for myself while I am on HRT?  Take over-the-counter and prescription medicines only as told by your health care provider.  Get mammograms, pelvic exams, and medical checkups as often as told by your health care provider.  Have Pap tests done as often as told by your health care provider. A Pap test is sometimes called a Pap smear. It is a screening test that is used to check for signs of cancer of the cervix and vagina. A Pap test can also identify the presence of infection or precancerous changes. Pap tests may be done: ? Every 3 years, starting at age 23. ? Every 5 years, starting after age 79, in combination with testing for human papillomavirus (HPV). ? More often or less often depending on other medical conditions you have, your age, and other risk factors.  It is your responsibility to get your Pap test results. Ask your health care provider or the department performing the test when your results will be ready.  Keep all follow-up visits as told by your health care provider. This is important. When should I seek medical care? Talk with your health care provider if:  You have any of these: ? Pain or swelling in your legs. ? Shortness of breath. ? Chest pain. ? Lumps or changes in your breasts or armpits. ? Slurred speech. ? Pain, burning, or bleeding when you urine.  You develop any of these: ? Unusual vaginal bleeding. ? Dizziness or headaches. ? Weakness or numbness in any part of your arms or legs. ? Pain in your abdomen.  This information is not intended to replace advice given to you by your health care provider. Make sure you discuss any questions you have with your health care provider. Document Released: 11/09/2002 Document Revised: 01/08/2016 Document Reviewed: 08/14/2014 Elsevier Interactive Patient Education  2017 [employer].

## 2017-01-14 ENCOUNTER — Ambulatory Visit
Admission: RE | Admit: 2017-01-14 | Discharge: 2017-01-14 | Disposition: A | Discharge status: Home / Self Care | Payer: BC Managed Care – PPO | Source: Ambulatory Visit | Attending: Family Medicine | Admitting: Family Medicine

## 2017-01-14 DIAGNOSIS — R928 Other abnormal and inconclusive findings on diagnostic imaging of breast: Secondary | ICD-10-CM | POA: Insufficient documentation

## 2017-03-16 ENCOUNTER — Ambulatory Visit (INDEPENDENT_AMBULATORY_CARE_PROVIDER_SITE_OTHER): Payer: BC Managed Care – PPO | Admitting: Family Medicine

## 2017-03-16 ENCOUNTER — Encounter: Payer: Self-pay | Admitting: Family Medicine

## 2017-03-16 VITALS — BP 126/83 | HR 83 | Temp 97.6°F | Ht 63.5 in | Wt 142.1 lb

## 2017-03-16 DIAGNOSIS — M9908 Segmental and somatic dysfunction of rib cage: Secondary | ICD-10-CM

## 2017-03-16 DIAGNOSIS — M9905 Segmental and somatic dysfunction of pelvic region: Secondary | ICD-10-CM | POA: Diagnosis not present

## 2017-03-16 DIAGNOSIS — M9903 Segmental and somatic dysfunction of lumbar region: Secondary | ICD-10-CM

## 2017-03-16 DIAGNOSIS — M9904 Segmental and somatic dysfunction of sacral region: Secondary | ICD-10-CM | POA: Diagnosis not present

## 2017-03-16 DIAGNOSIS — H6982 Other specified disorders of Eustachian tube, left ear: Secondary | ICD-10-CM | POA: Diagnosis not present

## 2017-03-16 DIAGNOSIS — N9489 Other specified conditions associated with female genital organs and menstrual cycle: Secondary | ICD-10-CM | POA: Diagnosis not present

## 2017-03-16 DIAGNOSIS — M999 Biomechanical lesion, unspecified: Secondary | ICD-10-CM

## 2017-03-16 MED ORDER — PREDNISONE 50 MG PO TABS: 50.0000 mg | ORAL_TABLET | Freq: Every day | ORAL | 0 refills | Status: DC

## 2017-03-16 NOTE — Progress Notes (Signed)
BP 126/83 (BP Location: Left Arm, Patient Position: Sitting, Cuff Size: Normal)   Pulse 83   Temp 97.6 F (36.4 C)   Ht 5' 3.5" (1.613 m)   Wt 142 lb 2 oz (64.5 kg)   LMP 07/26/2010 (Approximate)   SpO2 100%   BMI 24.78 kg/m    Subjective:    Patient ID: Sabrina Baxter, female    DOB: [birth year], 52 y.o.   MRN: [id number]  HPI: Sabrina Baxter is a 52 y.o. female  Chief Complaint  Patient presents with  . Pelvic Pain   Sabrina Baxter comes in today for evaluation and possible treatment of her pelvic pain. She notes that she did really well following her last appointment and had been feeling well until right before the holidays. She notes that she is confused as her hips pain seems to go back and forth. It's her R hip today, but was her L hip last visit. It has been bothering her for a couple of weeks. It's been waking her up at night. It's sore in nature. Worse with laying down and sitting, better with standing and walking. OMT seems to help and she would like to do that again today. She is otherwise feeling well and continuing to do her stretches, but it feels like it's too deep for her to get to.   EAG CLOGGED Duration: days Involved ear(s):  bilateral L>R Sensation of feeling clogged/plugged: yes Decreased/muffled hearing:yes Ear pain: no Fever: no Otorrhea: no Hearing loss: no Upper respiratory infection symptoms: no Using Q-Tips: no Status: worse History of cerumenosis: no Treatments attempted: none  Relevant past medical, surgical, family and social history reviewed and updated as indicated. Interim medical history since our last visit reviewed. Allergies and medications reviewed and updated.  Review of Systems  Constitutional: Negative.   Respiratory: Negative.   Cardiovascular: Negative.   Genitourinary: Positive for pelvic pain. Negative for decreased urine volume, difficulty urinating, dyspareunia, dysuria, enuresis, flank pain, frequency, genital sores, hematuria,  menstrual problem, urgency, vaginal bleeding, vaginal discharge and vaginal pain.  Musculoskeletal: Positive for myalgias. Negative for arthralgias, back pain, gait problem, joint swelling, neck pain and neck stiffness.  Skin: Negative.   Neurological: Negative.   Psychiatric/Behavioral: Negative.     Per HPI unless specifically indicated above     Objective:    BP 126/83 (BP Location: Left Arm, Patient Position: Sitting, Cuff Size: Normal)   Pulse 83   Temp 97.6 F (36.4 C)   Ht 5' 3.5" (1.613 m)   Wt 142 lb 2 oz (64.5 kg)   LMP 07/26/2010 (Approximate)   SpO2 100%   BMI 24.78 kg/m   Wt Readings from Last 3 Encounters:  03/16/17 142 lb 2 oz (64.5 kg)  12/24/16 141 lb 6 oz (64.1 kg)  08/06/16 141 lb (64 kg)    Physical Exam  Constitutional: She is oriented to person, place, and time. She appears well-developed and well-nourished. No distress.  HENT:  Head: Normocephalic and atraumatic.  Right Ear: Hearing, external ear and ear canal normal. A middle ear effusion is present.  Left Ear: Hearing, external ear and ear canal normal. A middle ear effusion is present.  Nose: Nose normal.  Mouth/Throat: Uvula is midline, oropharynx is clear and moist and mucous membranes are normal. No oropharyngeal exudate.  Eyes: Conjunctivae, EOM and lids are normal. Pupils are equal, round, and reactive to light. Right eye exhibits no discharge. Left eye exhibits no discharge. No scleral icterus.  Neck: Normal range of motion.  Neck supple. No JVD present. No tracheal deviation present. No thyromegaly present.  Pulmonary/Chest: Effort normal. No stridor. No respiratory distress.  Musculoskeletal: Normal range of motion.  Lymphadenopathy:    She has no cervical adenopathy.  Neurological: She is alert and oriented to person, place, and time.  Skin: Skin is intact. No rash noted. She is not diaphoretic.  Psychiatric: She has a normal mood and affect. Her speech is normal and behavior is normal.  Judgment and thought content normal. Cognition and memory are normal.  Musculoskeletal:  Exam found Decreased ROM, Tissue texture changes, Tenderness to palpation and Asymmetry of patient's  ribs, lumbar, pelvis, sacrum, lower extremity and abdomen Osteopathic Structural Exam:   Ribs: Ribs 5-9 locked up on the L, Ribs 8-9 locked up on the R  Lumbar: psoas spasm bilaterally, increased lumbar lordosis  Pelvis: Anterior R innominate  Sacrum: L on L torsion  Lower Extremity: obturator hypertonic on the R, glut hypertonic on the R, adductor hypertonic on the R, IT band hypertonic on the R  Abdomen: pelvic diaphragm drag into R innominate     Results for orders placed or performed in visit on 02/21/15  [facility] COLONOSCOPY  Result Value Ref Range   [facility] Colonoscopy Repeat Colon 10 years   EGD  Result Value Ref Range   EGD        Assessment & Plan:   Problem List Items Addressed This Visit      Genitourinary   Pelvic pain syndrome - Primary    Today, seems to have a tight obturator internus. Discussed stretches- given today. She does have some somatic dysfunction that I think is contributing to her symptoms. Treated today with good results as below. Call with any concerns.        Other Visit Diagnoses    Dysfunction of left eustachian tube       Will treat with prednisone. Call if not getting better or getting worse.    Somatic dysfunction of spine, lumbar       Segmental and somatic dysfunction of sacral region       Segmental dysfunction of pelvic region       Nonallopathic lesion of lower extremities       Nonallopathic lesion of abdomen       Segmental dysfunction of rib cage         After verbal consent was obtained, patient was treated today with osteopathic manipulative medicine to the regions of the ribs, lumbar, pelvis, sacrum, abdomen and lower extremity using the techniques of myofascial release, counterstrain, muscle energy, HVLA and soft tissue. Areas of compensation relating to  her primary pain source also treated. Patient tolerated the procedure well with good objective and good subjective improvement in symptoms. She left the room in good condition. She was advised to stay well hydrated and that she may have some soreness following the procedure. If not improving or worsening, she will call and come in. She will return for reevaluation  on a PRN basis.   Follow up plan: Return if symptoms worsen or fail to improve.

## 2017-03-16 NOTE — Assessment & Plan Note (Signed)
Today, seems to have a tight obturator internus. Discussed stretches- given today. She does have some somatic dysfunction that I think is contributing to her symptoms. Treated today with good results as below. Call with any concerns.

## 2017-04-21 ENCOUNTER — Other Ambulatory Visit: Payer: Self-pay | Admitting: Family Medicine

## 2017-06-18 ENCOUNTER — Encounter: Payer: Self-pay | Admitting: Family Medicine

## 2017-06-18 ENCOUNTER — Ambulatory Visit (INDEPENDENT_AMBULATORY_CARE_PROVIDER_SITE_OTHER): Payer: BC Managed Care – PPO | Admitting: Family Medicine

## 2017-06-18 VITALS — BP 109/73 | HR 73 | Temp 97.1°F | Ht 62.6 in | Wt 143.6 lb

## 2017-06-18 DIAGNOSIS — Z1231 Encounter for screening mammogram for malignant neoplasm of breast: Secondary | ICD-10-CM

## 2017-06-18 DIAGNOSIS — Z0000 Encounter for general adult medical examination without abnormal findings: Secondary | ICD-10-CM

## 2017-06-18 DIAGNOSIS — Z124 Encounter for screening for malignant neoplasm of cervix: Secondary | ICD-10-CM

## 2017-06-18 DIAGNOSIS — Z1239 Encounter for other screening for malignant neoplasm of breast: Secondary | ICD-10-CM

## 2017-06-18 LAB — UA/M W/RFLX CULTURE, ROUTINE
Bilirubin, UA: NEGATIVE
Glucose, UA: NEGATIVE
KETONES UA: NEGATIVE
LEUKOCYTES UA: NEGATIVE
NITRITE UA: NEGATIVE
PROTEIN UA: NEGATIVE
RBC, UA: NEGATIVE
Specific Gravity, UA: <1.005 — ABNORMAL LOW (ref 1.005–1.030)
Urobilinogen, Ur: 0.2 mg/dL (ref 0.2–1.0)
pH, UA: 5.5 (ref 5.0–7.5)

## 2017-06-18 MED ORDER — CROMOLYN SODIUM 4 % OP SOLN: 2.0000 [drp] | Freq: Four times a day (QID) | OPHTHALMIC | 12 refills | Status: DC

## 2017-06-18 MED ORDER — PANTOPRAZOLE SODIUM 20 MG PO TBEC: 20.0000 mg | DELAYED_RELEASE_TABLET | Freq: Every day | ORAL | 1 refills | Status: DC

## 2017-06-18 NOTE — Progress Notes (Signed)
BP 109/73 (BP Location: Left Arm, Patient Position: Sitting, Cuff Size: Normal)   Pulse 73   Temp (!) 97.1 F (36.2 C)   Ht 5' 2.6" (1.59 m)   Wt 143 lb 9 oz (65.1 kg)   LMP 07/26/2010 (Approximate)   SpO2 98%   BMI 25.76 kg/m    Subjective:    Patient ID: Sabrina Baxter, female    DOB: 07/05/1965, 52 y.o.   MRN: [id number]  HPI: Sabrina Baxter is a 52 y.o. female presenting on 06/18/2017 for comprehensive medical examination. Current medical complaints include:none  She currently lives with: husband Menopausal Symptoms: no  Depression Screen done today and results listed below:  Depression screen [facility] 2/9 06/18/2017 12/24/2016  Decreased Interest 0 0  Down, Depressed, Hopeless 0 0  PHQ - 2 Score 0 0  Altered sleeping - 2  Tired, decreased energy - 1  Change in appetite - 0  Feeling bad or failure about yourself  - 0  Trouble concentrating - 0  Moving slowly or fidgety/restless - 0  Suicidal thoughts - 0  PHQ-9 Score - 3    Past Medical History:  Past Medical History:  Diagnosis Date  . Abnormal thyroid function test   . Chronic neck pain   . Chronic renal failure    worsened with NSAIDs  . 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 Fibroid    52yo  . Gastritis   . GERD (gastroesophageal reflux disease)   . Hyperreflexia   . Pelvic pain syndrome   . Pudendal neuralgia   . 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 Uterine fibroid     Surgical History:  Past Surgical History:  Procedure Laterality Date  . ABDOMINAL HYSTERECTOMY  2012   hemorrhaging  . APPENDECTOMY    . COLONOSCOPY WITH PROPOFOL N/A 01/22/2015   Procedure: COLONOSCOPY WITH PROPOFOL;  Surgeon: Lollie Sails, MD;  Location: [facility] ENDOSCOPY;  Service: Endoscopy;  Laterality: N/A;  . ESOPHAGOGASTRODUODENOSCOPY (EGD) WITH PROPOFOL N/A 01/22/2015   Procedure: ESOPHAGOGASTRODUODENOSCOPY (EGD) WITH PROPOFOL;  Surgeon: Lollie Sails, MD;  Location: [facility] ENDOSCOPY;  Service: Endoscopy;   Laterality: N/A;  . NISSEN FUNDOPLICATION  [id number]  . UTERINE FIBROID SURGERY  1983    Medications:  Current Outpatient Medications on File Prior to Visit  Medication Sig  . Cholecalciferol (VITAMIN D3) 2000 UNITS capsule Take by mouth.  . Magnesium 250 MG TABS Take by mouth.  . Multiple Vitamin (MULTI-DAY) TABS Take by mouth.   No current facility-administered medications on file prior to visit.     Allergies:  Allergies  Allergen Reactions  . Amoxicillin   . Diclofenac   . Mobic [Meloxicam]     Social History:  Social History   Socioeconomic History  . Marital status: Married    Spouse name: Not on file  . Number of children: Not on file  . Years of education: Not on file  . Highest education level: Not on file  Occupational History  . Not on file  Social Needs  . Financial resource strain: Not on file  . Food insecurity:    Worry: Not on file    Inability: Not on file  . Transportation needs:    Medical: Not on file    Non-medical: Not on file  Tobacco Use  . Smoking status: Never Smoker  . Smokeless tobacco: Never Used  Substance and Sexual Activity  . Alcohol use: No  . Drug use:  No  . Sexual activity: Yes    Birth control/protection: Surgical  Lifestyle  . Physical activity:    Days per week: Not on file    Minutes per session: Not on file  . Stress: Not on file  Relationships  . Social connections:    Talks on phone: Not on file    Gets together: Not on file    Attends religious service: Not on file    Active member of club or organization: Not on file    Attends meetings of clubs or organizations: Not on file    Relationship status: Not on file  . Intimate partner violence:    Fear of current or ex partner: Not on file    Emotionally abused: Not on file    Physically abused: Not on file    Forced sexual activity: Not on file  Other Topics Concern  . Not on file  Social History Narrative  . Not on file   Social History   Tobacco Use    Smoking Status Never Smoker  Smokeless Tobacco Never Used   Social History   Substance and Sexual Activity  Alcohol Use No    Family History:  Family History  Problem Relation Age of Onset  . Arthritis Mother        rheumatoid  . Depression Mother   . Hypertension Sister   . Thyroid disease Brother     Past medical history, surgical history, medications, allergies, family history and social history reviewed with patient today and changes made to appropriate areas of the chart.   Review of Systems  Constitutional: Positive for malaise/fatigue. Negative for chills, diaphoresis, fever and weight loss.  HENT: Positive for ear discharge. Negative for congestion, ear pain, hearing loss, nosebleeds, sinus pain, sore throat and tinnitus.   Eyes: Negative.   Respiratory: Negative.  Negative for stridor.   Cardiovascular: Negative.   Gastrointestinal: Positive for heartburn. Negative for abdominal pain, blood in stool, constipation, diarrhea, melena, nausea and vomiting.  Genitourinary: Negative.   Musculoskeletal: Negative.   Skin: Negative.   Neurological: Negative.   Endo/Heme/Allergies: Positive for environmental allergies. Negative for polydipsia. Does not bruise/bleed easily.  Psychiatric/Behavioral: Negative.     All other ROS negative except what is listed above and in the HPI.      Objective:    BP 109/73 (BP Location: Left Arm, Patient Position: Sitting, Cuff Size: Normal)   Pulse 73   Temp (!) 97.1 F (36.2 C)   Ht 5' 2.6" (1.59 m)   Wt 143 lb 9 oz (65.1 kg)   LMP 07/26/2010 (Approximate)   SpO2 98%   BMI 25.76 kg/m   Wt Readings from Last 3 Encounters:  06/18/17 143 lb 9 oz (65.1 kg)  03/16/17 142 lb 2 oz (64.5 kg)  12/24/16 141 lb 6 oz (64.1 kg)    Physical Exam  Constitutional: She is oriented to person, place, and time. She appears well-developed and well-nourished. No distress.  HENT:  Head: Normocephalic and atraumatic.  Right Ear: Hearing,  tympanic membrane, external ear and ear canal normal.  Left Ear: Hearing, tympanic membrane, external ear and ear canal normal.  Nose: Nose normal.  Mouth/Throat: Uvula is midline, oropharynx is clear and moist and mucous membranes are normal. No oropharyngeal exudate.  Eyes: Pupils are equal, round, and reactive to light. Conjunctivae, EOM and lids are normal. Right eye exhibits no discharge. Left eye exhibits no discharge. No scleral icterus.  Neck: Normal range of motion. Neck supple.  No JVD present. No tracheal deviation present. No thyromegaly present.  Cardiovascular: Normal rate, regular rhythm, normal heart sounds and intact distal pulses. Exam reveals no gallop and no friction rub.  No murmur heard. Pulmonary/Chest: Effort normal and breath sounds normal. No stridor. No respiratory distress. She has no wheezes. She has no rales. She exhibits no tenderness. Right breast exhibits no inverted nipple, no mass, no nipple discharge, no skin change and no tenderness. Left breast exhibits no inverted nipple, no mass, no nipple discharge, no skin change and no tenderness. No breast swelling, tenderness, discharge or bleeding. Breasts are symmetrical.  Abdominal: Soft. Bowel sounds are normal. She exhibits no distension and no mass. There is no tenderness. There is no rebound and no guarding. No hernia. Hernia confirmed negative in the right inguinal area and confirmed negative in the left inguinal area.  Genitourinary: Vagina normal. No labial fusion. There is no rash, tenderness, lesion or injury on the right labia. There is no rash, tenderness, lesion or injury on the left labia. No erythema, tenderness or bleeding in the vagina. No foreign body in the vagina. No signs of injury around the vagina. No vaginal discharge found.  Musculoskeletal: Normal range of motion. She exhibits no edema, tenderness or deformity.  Lymphadenopathy:    She has no cervical adenopathy. No inguinal adenopathy noted on the  right or left side.  Neurological: She is alert and oriented to person, place, and time. She displays normal reflexes. No cranial nerve deficit or sensory deficit. She exhibits normal muscle tone. Coordination normal.  Skin: Skin is warm, dry and intact. Capillary refill takes less than 2 seconds. No rash noted. She is not diaphoretic. No erythema. No pallor.  Psychiatric: She has a normal mood and affect. Her speech is normal and behavior is normal. Judgment and thought content normal. Cognition and memory are normal.  Nursing note and vitals reviewed.   Results for orders placed or performed in visit on 02/21/15  [facility] COLONOSCOPY  Result Value Ref Range   [facility] Colonoscopy Repeat Colon 10 years   EGD  Result Value Ref Range   EGD        Assessment & Plan:   Problem List Items Addressed This Visit    None    Visit Diagnoses    Routine general medical examination at a health care facility    -  Primary   Vaccines up to date. Screening labs checked today. Mammogram ordered. Pap done. Colonoscopy up to date. Call with any concerns. Continue to monitor.    Relevant Orders   CBC with Differential/Platelet   Comprehensive metabolic panel   Lipid Panel w/o Chol/HDL Ratio   Thyroid Panel With TSH   UA/M w/rflx Culture, Routine   VITAMIN D 25 Hydroxy (Vit-D Deficiency, Fractures)   Screening for breast cancer       Mammogram ordered today.   Relevant Orders   MM DIGITAL SCREENING BILATERAL   Screening for cervical cancer       Pap done today.   Relevant Orders   IGP, Aptima HPV, rfx 16/18,45       Follow up plan: Return in about 1 year (around 06/19/2018) for Physical.   LABORATORY TESTING:  - Pap smear: done today  IMMUNIZATIONS:   - Tdap: Tetanus vaccination status reviewed: last tetanus booster within 10 years. - Influenza: Up to date - Pneumovax: Up to date - Prevnar: Not applicable - HPV: Not applicable - Zostavax vaccine: Not applicable  SCREENING: -Mammogram: Order  in  - Colonoscopy: Up to date  - Bone Density: Not applicable   PATIENT COUNSELING:   Advised to take 1 mg of folate supplement per day if capable of pregnancy.   Sexuality: Discussed sexually transmitted diseases, partner selection, use of condoms, avoidance of unintended pregnancy  and contraceptive alternatives.   Advised to avoid cigarette smoking.  I discussed with the patient that most people either abstain from alcohol or drink within safe limits (<=14/week and <=4 drinks/occasion for males, <=7/weeks and <= 3 drinks/occasion for females) and that the risk for alcohol disorders and other health effects rises proportionally with the number of drinks per week and how often a drinker exceeds daily limits.  Discussed cessation/primary prevention of drug use and availability of treatment for abuse.   Diet: Encouraged to adjust caloric intake to maintain  or achieve ideal body weight, to reduce intake of dietary saturated fat and total fat, to limit sodium intake by avoiding high sodium foods and not adding table salt, and to maintain adequate dietary potassium and calcium preferably from fresh fruits, vegetables, and low-fat dairy products.    stressed the importance of regular exercise  Injury prevention: Discussed safety belts, safety helmets, smoke detector, smoking near bedding or upholstery.   Dental health: Discussed importance of regular tooth brushing, flossing, and dental visits.    NEXT PREVENTATIVE PHYSICAL DUE IN 1 YEAR. Return in about 1 year (around 06/19/2018) for Physical.

## 2017-06-18 NOTE — Patient Instructions (Addendum)
[facility] at [facility]  Address: [address] [place], [place], [place] [address]  Phone: [phone]   Health Maintenance for Postmenopausal Women Menopause is a normal process in which your reproductive ability comes to an end. This process happens gradually over a span of months to years, usually between the ages of 55 and 17. Menopause is complete when you have missed 12 consecutive menstrual periods. It is important to talk with your health care provider about some of the most common conditions that affect postmenopausal women, such as heart disease, cancer, and bone loss (osteoporosis). Adopting a healthy lifestyle and getting preventive care can help to promote your health and wellness. Those actions can also lower your chances of developing some of these common conditions. What should I know about menopause? During menopause, you may experience a number of symptoms, such as:  Moderate-to-severe hot flashes.  Night sweats.  Decrease in sex drive.  Mood swings.  Headaches.  Tiredness.  Irritability.  Memory problems.  Insomnia.  Choosing to treat or not to treat menopausal changes is an individual decision that you make with your health care provider. What should I know about hormone replacement therapy and supplements? Hormone therapy products are effective for treating symptoms that are associated with menopause, such as hot flashes and night sweats. Hormone replacement carries certain risks, especially as you become older. If you are thinking about using estrogen or estrogen with progestin treatments, discuss the benefits and risks with your health care provider. What should I know about heart disease and stroke? Heart disease, heart attack, and stroke become more likely as you age. This may be due, in part, to the hormonal changes that your body experiences during menopause. These can affect how your body processes dietary fats, triglycerides, and  cholesterol. Heart attack and stroke are both medical emergencies. There are many things that you can do to help prevent heart disease and stroke:  Have your blood pressure checked at least every 1-2 years. High blood pressure causes heart disease and increases the risk of stroke.  If you are 7-75 years old, ask your health care provider if you should take aspirin to prevent a heart attack or a stroke.  Do not use any tobacco products, including cigarettes, chewing tobacco, or electronic cigarettes. If you need help quitting, ask your health care provider.  It is important to eat a healthy diet and maintain a healthy weight. ? Be sure to include plenty of vegetables, fruits, low-fat dairy products, and lean protein. ? Avoid eating foods that are high in solid fats, added sugars, or salt (sodium).  Get regular exercise. This is one of the most important things that you can do for your health. ? Try to exercise for at least 150 minutes each week. The type of exercise that you do should increase your heart rate and make you sweat. This is known as moderate-intensity exercise. ? Try to do strengthening exercises at least twice each week. Do these in addition to the moderate-intensity exercise.  Know your numbers.Ask your health care provider to check your cholesterol and your blood glucose. Continue to have your blood tested as directed by your health care provider.  What should I know about cancer screening? There are several types of cancer. Take the following steps to reduce your risk and to catch any cancer development as early as possible. Breast Cancer  Practice breast self-awareness. ? This means understanding how your breasts normally appear and feel. ? It also means  doing regular breast self-exams. Let your health care provider know about any changes, no matter how small.  If you are 59 or older, have a clinician do a breast exam (clinical breast exam or CBE) every year. Depending  on your age, family history, and medical history, it may be recommended that you also have a yearly breast X-ray (mammogram).  If you have a family history of breast cancer, talk with your health care provider about genetic screening.  If you are at high risk for breast cancer, talk with your health care provider about having an MRI and a mammogram every year.  Breast cancer (BRCA) gene test is recommended for women who have family members with BRCA-related cancers. Results of the assessment will determine the need for genetic counseling and BRCA1 and for BRCA2 testing. BRCA-related cancers include these types: ? Breast. This occurs in males or females. ? Ovarian. ? Tubal. This may also be called fallopian tube cancer. ? Cancer of the abdominal or pelvic lining (peritoneal cancer). ? Prostate. ? Pancreatic.  Cervical, Uterine, and Ovarian Cancer Your health care provider may recommend that you be screened regularly for cancer of the pelvic organs. These include your ovaries, uterus, and vagina. This screening involves a pelvic exam, which includes checking for microscopic changes to the surface of your cervix (Pap test).  For women ages 21-65, health care providers may recommend a pelvic exam and a Pap test every three years. For women ages 32-65, they may recommend the Pap test and pelvic exam, combined with testing for human papilloma virus (HPV), every five years. Some types of HPV increase your risk of cervical cancer. Testing for HPV may also be done on women of any age who have unclear Pap test results.  Other health care providers may not recommend any screening for nonpregnant women who are considered low risk for pelvic cancer and have no symptoms. Ask your health care provider if a screening pelvic exam is right for you.  If you have had past treatment for cervical cancer or a condition that could lead to cancer, you need Pap tests and screening for cancer for at least 20 years after  your treatment. If Pap tests have been discontinued for you, your risk factors (such as having a new sexual partner) need to be reassessed to determine if you should start having screenings again. Some women have medical problems that increase the chance of getting cervical cancer. In these cases, your health care provider may recommend that you have screening and Pap tests more often.  If you have a family history of uterine cancer or ovarian cancer, talk with your health care provider about genetic screening.  If you have vaginal bleeding after reaching menopause, tell your health care provider.  There are currently no reliable tests available to screen for ovarian cancer.  Lung Cancer Lung cancer screening is recommended for adults 35-50 years old who are at high risk for lung cancer because of a history of smoking. A yearly low-dose CT scan of the lungs is recommended if you:  Currently smoke.  Have a history of at least 30 pack-years of smoking and you currently smoke or have quit within the past 15 years. A pack-year is smoking an average of one pack of cigarettes per day for one year.  Yearly screening should:  Continue until it has been 15 years since you quit.  Stop if you develop a health problem that would prevent you from having lung cancer treatment.  Colorectal  Cancer  This type of cancer can be detected and can often be prevented.  Routine colorectal cancer screening usually begins at age 61 and continues through age 22.  If you have risk factors for colon cancer, your health care provider may recommend that you be screened at an earlier age.  If you have a family history of colorectal cancer, talk with your health care provider about genetic screening.  Your health care provider may also recommend using home test kits to check for hidden blood in your stool.  A small camera at the end of a tube can be used to examine your colon directly (sigmoidoscopy or colonoscopy).  This is done to check for the earliest forms of colorectal cancer.  Direct examination of the colon should be repeated every 5-10 years until age 50. However, if early forms of precancerous polyps or small growths are found or if you have a family history or genetic risk for colorectal cancer, you may need to be screened more often.  Skin Cancer  Check your skin from head to toe regularly.  Monitor any moles. Be sure to tell your health care provider: ? About any new moles or changes in moles, especially if there is a change in a mole's shape or color. ? If you have a mole that is larger than the size of a pencil eraser.  If any of your family members has a history of skin cancer, especially at a young age, talk with your health care provider about genetic screening.  Always use sunscreen. Apply sunscreen liberally and repeatedly throughout the day.  Whenever you are outside, protect yourself by wearing long sleeves, pants, a wide-brimmed hat, and sunglasses.  What should I know about osteoporosis? Osteoporosis is a condition in which bone destruction happens more quickly than new bone creation. After menopause, you may be at an increased risk for osteoporosis. To help prevent osteoporosis or the bone fractures that can happen because of osteoporosis, the following is recommended:  If you are 58-61 years old, get at least 1,000 mg of calcium and at least 600 mg of vitamin D per day.  If you are older than age 65 but younger than age 38, get at least 1,200 mg of calcium and at least 600 mg of vitamin D per day.  If you are older than age 78, get at least 1,200 mg of calcium and at least 800 mg of vitamin D per day.  Smoking and excessive alcohol intake increase the risk of osteoporosis. Eat foods that are rich in calcium and vitamin D, and do weight-bearing exercises several times each week as directed by your health care provider. What should I know about how menopause affects my mental  health? Depression may occur at any age, but it is more common as you become older. Common symptoms of depression include:  Low or sad mood.  Changes in sleep patterns.  Changes in appetite or eating patterns.  Feeling an overall lack of motivation or enjoyment of activities that you previously enjoyed.  Frequent crying spells.  Talk with your health care provider if you think that you are experiencing depression. What should I know about immunizations? It is important that you get and maintain your immunizations. These include:  Tetanus, diphtheria, and pertussis (Tdap) booster vaccine.  Influenza every year before the flu season begins.  Pneumonia vaccine.  Shingles vaccine.  Your health care provider may also recommend other immunizations. This information is not intended to replace advice given to  you by your health care provider. Make sure you discuss any questions you have with your health care provider. Document Released: 04/04/2005 Document Revised: 08/31/2015 Document Reviewed: 11/14/2014 Elsevier Interactive Patient Education  2018 [employer] Maintenance for Postmenopausal Women Menopause is a normal process in which your reproductive ability comes to an end. This process happens gradually over a span of months to years, usually between the ages of 35 and 37. Menopause is complete when you have missed 12 consecutive menstrual periods. It is important to talk with your health care provider about some of the most common conditions that affect postmenopausal women, such as heart disease, cancer, and bone loss (osteoporosis). Adopting a healthy lifestyle and getting preventive care can help to promote your health and wellness. Those actions can also lower your chances of developing some of these common conditions. What should I know about menopause? During menopause, you may experience a number of symptoms, such as:  Moderate-to-severe hot flashes.  Night  sweats.  Decrease in sex drive.  Mood swings.  Headaches.  Tiredness.  Irritability.  Memory problems.  Insomnia.  Choosing to treat or not to treat menopausal changes is an individual decision that you make with your health care provider. What should I know about hormone replacement therapy and supplements? Hormone therapy products are effective for treating symptoms that are associated with menopause, such as hot flashes and night sweats. Hormone replacement carries certain risks, especially as you become older. If you are thinking about using estrogen or estrogen with progestin treatments, discuss the benefits and risks with your health care provider. What should I know about heart disease and stroke? Heart disease, heart attack, and stroke become more likely as you age. This may be due, in part, to the hormonal changes that your body experiences during menopause. These can affect how your body processes dietary fats, triglycerides, and cholesterol. Heart attack and stroke are both medical emergencies. There are many things that you can do to help prevent heart disease and stroke:  Have your blood pressure checked at least every 1-2 years. High blood pressure causes heart disease and increases the risk of stroke.  If you are 59-5 years old, ask your health care provider if you should take aspirin to prevent a heart attack or a stroke.  Do not use any tobacco products, including cigarettes, chewing tobacco, or electronic cigarettes. If you need help quitting, ask your health care provider.  It is important to eat a healthy diet and maintain a healthy weight. ? Be sure to include plenty of vegetables, fruits, low-fat dairy products, and lean protein. ? Avoid eating foods that are high in solid fats, added sugars, or salt (sodium).  Get regular exercise. This is one of the most important things that you can do for your health. ? Try to exercise for at least 150 minutes each week.  The type of exercise that you do should increase your heart rate and make you sweat. This is known as moderate-intensity exercise. ? Try to do strengthening exercises at least twice each week. Do these in addition to the moderate-intensity exercise.  Know your numbers.Ask your health care provider to check your cholesterol and your blood glucose. Continue to have your blood tested as directed by your health care provider.  What should I know about cancer screening? There are several types of cancer. Take the following steps to reduce your risk and to catch any cancer development as early as possible. Breast Cancer  Practice breast  self-awareness. ? This means understanding how your breasts normally appear and feel. ? It also means doing regular breast self-exams. Let your health care provider know about any changes, no matter how small.  If you are 37 or older, have a clinician do a breast exam (clinical breast exam or CBE) every year. Depending on your age, family history, and medical history, it may be recommended that you also have a yearly breast X-ray (mammogram).  If you have a family history of breast cancer, talk with your health care provider about genetic screening.  If you are at high risk for breast cancer, talk with your health care provider about having an MRI and a mammogram every year.  Breast cancer (BRCA) gene test is recommended for women who have family members with BRCA-related cancers. Results of the assessment will determine the need for genetic counseling and BRCA1 and for BRCA2 testing. BRCA-related cancers include these types: ? Breast. This occurs in males or females. ? Ovarian. ? Tubal. This may also be called fallopian tube cancer. ? Cancer of the abdominal or pelvic lining (peritoneal cancer). ? Prostate. ? Pancreatic.  Cervical, Uterine, and Ovarian Cancer Your health care provider may recommend that you be screened regularly for cancer of the pelvic  organs. These include your ovaries, uterus, and vagina. This screening involves a pelvic exam, which includes checking for microscopic changes to the surface of your cervix (Pap test).  For women ages 21-65, health care providers may recommend a pelvic exam and a Pap test every three years. For women ages 70-65, they may recommend the Pap test and pelvic exam, combined with testing for human papilloma virus (HPV), every five years. Some types of HPV increase your risk of cervical cancer. Testing for HPV may also be done on women of any age who have unclear Pap test results.  Other health care providers may not recommend any screening for nonpregnant women who are considered low risk for pelvic cancer and have no symptoms. Ask your health care provider if a screening pelvic exam is right for you.  If you have had past treatment for cervical cancer or a condition that could lead to cancer, you need Pap tests and screening for cancer for at least 20 years after your treatment. If Pap tests have been discontinued for you, your risk factors (such as having a new sexual partner) need to be reassessed to determine if you should start having screenings again. Some women have medical problems that increase the chance of getting cervical cancer. In these cases, your health care provider may recommend that you have screening and Pap tests more often.  If you have a family history of uterine cancer or ovarian cancer, talk with your health care provider about genetic screening.  If you have vaginal bleeding after reaching menopause, tell your health care provider.  There are currently no reliable tests available to screen for ovarian cancer.  Lung Cancer Lung cancer screening is recommended for adults 50-24 years old who are at high risk for lung cancer because of a history of smoking. A yearly low-dose CT scan of the lungs is recommended if you:  Currently smoke.  Have a history of at least 30 pack-years of  smoking and you currently smoke or have quit within the past 15 years. A pack-year is smoking an average of one pack of cigarettes per day for one year.  Yearly screening should:  Continue until it has been 15 years since you quit.  Stop if  you develop a health problem that would prevent you from having lung cancer treatment.  Colorectal Cancer  This type of cancer can be detected and can often be prevented.  Routine colorectal cancer screening usually begins at age 42 and continues through age 35.  If you have risk factors for colon cancer, your health care provider may recommend that you be screened at an earlier age.  If you have a family history of colorectal cancer, talk with your health care provider about genetic screening.  Your health care provider may also recommend using home test kits to check for hidden blood in your stool.  A small camera at the end of a tube can be used to examine your colon directly (sigmoidoscopy or colonoscopy). This is done to check for the earliest forms of colorectal cancer.  Direct examination of the colon should be repeated every 5-10 years until age 48. However, if early forms of precancerous polyps or small growths are found or if you have a family history or genetic risk for colorectal cancer, you may need to be screened more often.  Skin Cancer  Check your skin from head to toe regularly.  Monitor any moles. Be sure to tell your health care provider: ? About any new moles or changes in moles, especially if there is a change in a mole's shape or color. ? If you have a mole that is larger than the size of a pencil eraser.  If any of your family members has a history of skin cancer, especially at a young age, talk with your health care provider about genetic screening.  Always use sunscreen. Apply sunscreen liberally and repeatedly throughout the day.  Whenever you are outside, protect yourself by wearing long sleeves, pants, a wide-brimmed  hat, and sunglasses.  What should I know about osteoporosis? Osteoporosis is a condition in which bone destruction happens more quickly than new bone creation. After menopause, you may be at an increased risk for osteoporosis. To help prevent osteoporosis or the bone fractures that can happen because of osteoporosis, the following is recommended:  If you are 17-34 years old, get at least 1,000 mg of calcium and at least 600 mg of vitamin D per day.  If you are older than age 39 but younger than age 82, get at least 1,200 mg of calcium and at least 600 mg of vitamin D per day.  If you are older than age 2, get at least 1,200 mg of calcium and at least 800 mg of vitamin D per day.  Smoking and excessive alcohol intake increase the risk of osteoporosis. Eat foods that are rich in calcium and vitamin D, and do weight-bearing exercises several times each week as directed by your health care provider. What should I know about how menopause affects my mental health? Depression may occur at any age, but it is more common as you become older. Common symptoms of depression include:  Low or sad mood.  Changes in sleep patterns.  Changes in appetite or eating patterns.  Feeling an overall lack of motivation or enjoyment of activities that you previously enjoyed.  Frequent crying spells.  Talk with your health care provider if you think that you are experiencing depression. What should I know about immunizations? It is important that you get and maintain your immunizations. These include:  Tetanus, diphtheria, and pertussis (Tdap) booster vaccine.  Influenza every year before the flu season begins.  Pneumonia vaccine.  Shingles vaccine.  Your health care provider  may also recommend other immunizations. This information is not intended to replace advice given to you by your health care provider. Make sure you discuss any questions you have with your health care provider. Document Released:  04/04/2005 Document Revised: 08/31/2015 Document Reviewed: 11/14/2014 Elsevier Interactive Patient Education  2018 [employer].

## 2017-06-19 LAB — CBC WITH DIFFERENTIAL/PLATELET
BASOS: 0 %
Basophils Absolute: 0 10*3/uL (ref 0.0–0.2)
EOS (ABSOLUTE): 0.1 10*3/uL (ref 0.0–0.4)
EOS: 2 %
HEMATOCRIT: 42.2 % (ref 34.0–46.6)
Hemoglobin: 13.3 g/dL (ref 11.1–15.9)
IMMATURE GRANS (ABS): 0 10*3/uL (ref 0.0–0.1)
IMMATURE GRANULOCYTES: 0 %
LYMPHS: 42 %
Lymphocytes Absolute: 3.2 10*3/uL — ABNORMAL HIGH (ref 0.7–3.1)
MCH: 30 pg (ref 26.6–33.0)
MCHC: 31.5 g/dL (ref 31.5–35.7)
MCV: 95 fL (ref 79–97)
Monocytes Absolute: 0.4 10*3/uL (ref 0.1–0.9)
Monocytes: 5 %
NEUTROS ABS: 3.8 10*3/uL (ref 1.4–7.0)
NEUTROS PCT: 51 %
PLATELETS: 204 10*3/uL (ref 150–379)
RBC: 4.43 x10E6/uL (ref 3.77–5.28)
RDW: 13 % (ref 12.3–15.4)
WBC: 7.5 10*3/uL (ref 3.4–10.8)

## 2017-06-19 LAB — VITAMIN D 25 HYDROXY (VIT D DEFICIENCY, FRACTURES): VIT D 25 HYDROXY: 52.9 ng/mL (ref 30.0–100.0)

## 2017-06-19 LAB — THYROID PANEL WITH TSH
FREE THYROXINE INDEX: 1.5 (ref 1.2–4.9)
T3 UPTAKE RATIO: 26 % (ref 24–39)
T4, Total: 5.9 ug/dL (ref 4.5–12.0)
TSH: 4.33 u[IU]/mL (ref 0.450–4.500)

## 2017-06-19 LAB — COMPREHENSIVE METABOLIC PANEL
ALBUMIN: 4.7 g/dL (ref 3.5–5.5)
ALT: 20 IU/L (ref 0–32)
AST: 24 IU/L (ref 0–40)
Albumin/Globulin Ratio: 2.5 — ABNORMAL HIGH (ref 1.2–2.2)
Alkaline Phosphatase: 93 IU/L (ref 39–117)
BILIRUBIN TOTAL: 0.2 mg/dL (ref 0.0–1.2)
BUN / CREAT RATIO: 20 (ref 9–23)
BUN: 26 mg/dL — ABNORMAL HIGH (ref 6–24)
CALCIUM: 9.3 mg/dL (ref 8.7–10.2)
CHLORIDE: 100 mmol/L (ref 96–106)
CO2: 25 mmol/L (ref 20–29)
Creatinine, Ser: 1.28 mg/dL — ABNORMAL HIGH (ref 0.57–1.00)
GFR calc Af Amer: 55 mL/min/{1.73_m2} — ABNORMAL LOW (ref >59)
GFR, EST NON AFRICAN AMERICAN: 48 mL/min/{1.73_m2} — AB (ref >59)
GLOBULIN, TOTAL: 1.9 g/dL (ref 1.5–4.5)
Glucose: 77 mg/dL (ref 65–99)
Potassium: 4.5 mmol/L (ref 3.5–5.2)
SODIUM: 141 mmol/L (ref 134–144)
TOTAL PROTEIN: 6.6 g/dL (ref 6.0–8.5)

## 2017-06-19 LAB — LIPID PANEL W/O CHOL/HDL RATIO
Cholesterol, Total: 224 mg/dL — ABNORMAL HIGH (ref 100–199)
HDL: 86 mg/dL (ref >39)
LDL Calculated: 117 mg/dL — ABNORMAL HIGH (ref 0–99)
Triglycerides: 106 mg/dL (ref 0–149)
VLDL Cholesterol Cal: 21 mg/dL (ref 5–40)

## 2017-06-21 LAB — IGP, APTIMA HPV, RFX 16/18,45
HPV APTIMA: NEGATIVE
PAP SMEAR COMMENT: 0

## 2018-02-15 ENCOUNTER — Other Ambulatory Visit: Payer: Self-pay | Admitting: Family Medicine

## 2018-02-15 DIAGNOSIS — Z1231 Encounter for screening mammogram for malignant neoplasm of breast: Secondary | ICD-10-CM

## 2018-02-18 ENCOUNTER — Ambulatory Visit
Admission: RE | Admit: 2018-02-18 | Discharge: 2018-02-18 | Disposition: A | Discharge status: Home / Self Care | Payer: BC Managed Care – PPO | Source: Ambulatory Visit | Attending: Family Medicine | Admitting: Family Medicine

## 2018-02-18 DIAGNOSIS — Z1231 Encounter for screening mammogram for malignant neoplasm of breast: Secondary | ICD-10-CM | POA: Diagnosis not present

## 2018-06-25 ENCOUNTER — Telehealth: Payer: Self-pay | Admitting: Family Medicine

## 2018-06-25 MED ORDER — PANTOPRAZOLE SODIUM 20 MG PO TBEC: 20.0000 mg | DELAYED_RELEASE_TABLET | Freq: Every day | ORAL | 1 refills | Status: DC

## 2018-06-25 NOTE — Telephone Encounter (Signed)
Copied from [place] [phone]. Topic: Quick Communication - Rx Refill/Question >> Jun 25, 2018  2:57 PM Blase Mess A wrote: Medication: pantoprazole (PROTONIX) 20 MG tablet [[id number] Patient is requesting a 90 supply  Has the patient contacted their pharmacy? Yes  (Agent: If no, request that the patient contact the pharmacy for the refill.) (Agent: If yes, when and what did the pharmacy advise?)  Preferred Pharmacy (with phone number or street name):  [id number] - [place], [place] [address] [phone] (Phone) [phone] (Fax)     Agent: Please be advised that RX refills may take up to 3 business days. We ask that you follow-up with your pharmacy.

## 2018-06-29 ENCOUNTER — Encounter: Payer: BC Managed Care – PPO | Admitting: Family Medicine

## 2018-08-11 ENCOUNTER — Telehealth: Payer: Self-pay | Admitting: Family Medicine

## 2018-08-11 DIAGNOSIS — N644 Mastodynia: Secondary | ICD-10-CM

## 2018-08-11 NOTE — Telephone Encounter (Signed)
Please find out if she has a lump and where the pain is so we can get her properly scheduled and her mammogram ordered.

## 2018-08-11 NOTE — Telephone Encounter (Signed)
Copied from [place] [phone]. Topic: Appointment Scheduling - Scheduling Inquiry for Clinic >> Aug 10, 2018 12:25 PM Rainey Pines A wrote: Patient is experiencing some breast pain and is requesting an appointment for mammogram and would like a callback.Marland Kitchen

## 2018-08-11 NOTE — Telephone Encounter (Signed)
No lump known, left breast at 2 or 3 o'clock

## 2018-08-11 NOTE — Telephone Encounter (Signed)
Orders placed.

## 2018-08-26 ENCOUNTER — Ambulatory Visit
Admission: RE | Admit: 2018-08-26 | Discharge: 2018-08-26 | Disposition: A | Discharge status: Home / Self Care | Payer: BC Managed Care – PPO | Source: Ambulatory Visit | Attending: Family Medicine | Admitting: Family Medicine

## 2018-08-26 ENCOUNTER — Other Ambulatory Visit: Payer: Self-pay

## 2018-08-26 DIAGNOSIS — N644 Mastodynia: Secondary | ICD-10-CM

## 2018-12-17 ENCOUNTER — Other Ambulatory Visit: Payer: Self-pay | Admitting: Family Medicine

## 2018-12-17 NOTE — Telephone Encounter (Signed)
Forwarding medication refill request to PCP for review. 

## 2019-04-05 ENCOUNTER — Other Ambulatory Visit: Payer: Self-pay | Admitting: Family Medicine

## 2019-04-08 ENCOUNTER — Telehealth: Payer: Self-pay | Admitting: Family Medicine

## 2019-04-08 DIAGNOSIS — Z1231 Encounter for screening mammogram for malignant neoplasm of breast: Secondary | ICD-10-CM

## 2019-04-08 NOTE — Telephone Encounter (Signed)
Called patient. LVM for patient to return call to the office.

## 2019-04-08 NOTE — Telephone Encounter (Signed)
Patient notified that order has been placed.

## 2019-04-08 NOTE — Telephone Encounter (Signed)
Copied from [place] [phone]. Topic: General - Other >> Apr 07, 2019  4:47 PM Rainey Pines A wrote: Patient would like an order for mammogram placed at [facility] Patient would like a callback once order has been placed. Please advise

## 2019-04-08 NOTE — Telephone Encounter (Signed)
Order is in.

## 2019-04-13 ENCOUNTER — Ambulatory Visit
Admission: RE | Admit: 2019-04-13 | Discharge: 2019-04-13 | Disposition: A | Discharge status: Home / Self Care | Payer: BC Managed Care – PPO | Source: Ambulatory Visit | Attending: Family Medicine | Admitting: Family Medicine

## 2019-04-13 DIAGNOSIS — Z1231 Encounter for screening mammogram for malignant neoplasm of breast: Secondary | ICD-10-CM | POA: Insufficient documentation

## 2019-12-14 ENCOUNTER — Encounter: Payer: BC Managed Care – PPO | Admitting: Dermatology

## 2020-01-06 ENCOUNTER — Other Ambulatory Visit: Payer: Self-pay

## 2020-01-06 ENCOUNTER — Ambulatory Visit (INDEPENDENT_AMBULATORY_CARE_PROVIDER_SITE_OTHER): Admitting: Family Medicine

## 2020-01-06 ENCOUNTER — Encounter: Payer: Self-pay | Admitting: Family Medicine

## 2020-01-06 VITALS — BP 96/63 | HR 66 | Temp 98.1°F | Ht 63.27 in | Wt 142.4 lb

## 2020-01-06 DIAGNOSIS — Z0000 Encounter for general adult medical examination without abnormal findings: Secondary | ICD-10-CM | POA: Diagnosis not present

## 2020-01-06 DIAGNOSIS — Z1231 Encounter for screening mammogram for malignant neoplasm of breast: Secondary | ICD-10-CM | POA: Diagnosis not present

## 2020-01-06 DIAGNOSIS — Z23 Encounter for immunization: Secondary | ICD-10-CM | POA: Diagnosis not present

## 2020-01-06 LAB — URINALYSIS, ROUTINE W REFLEX MICROSCOPIC
Bilirubin, UA: NEGATIVE
Glucose, UA: NEGATIVE
Ketones, UA: NEGATIVE
Leukocytes,UA: NEGATIVE
Nitrite, UA: NEGATIVE
Protein,UA: NEGATIVE
RBC, UA: NEGATIVE
Specific Gravity, UA: 1.02 (ref 1.005–1.030)
Urobilinogen, Ur: 0.2 mg/dL (ref 0.2–1.0)
pH, UA: 5.5 (ref 5.0–7.5)

## 2020-01-06 NOTE — Patient Instructions (Signed)
Health Maintenance, Female Adopting a healthy lifestyle and getting preventive care are important in promoting health and wellness. Ask your health care provider about:  The right schedule for you to have regular tests and exams.  Things you can do on your own to prevent diseases and keep yourself healthy. What should I know about diet, weight, and exercise? Eat a healthy diet   Eat a diet that includes plenty of vegetables, fruits, low-fat dairy products, and lean protein.  Do not eat a lot of foods that are high in solid fats, added sugars, or sodium. Maintain a healthy weight Body mass index (BMI) is used to identify weight problems. It estimates body fat based on height and weight. Your health care provider can help determine your BMI and help you achieve or maintain a healthy weight. Get regular exercise Get regular exercise. This is one of the most important things you can do for your health. Most adults should:  Exercise for at least 150 minutes each week. The exercise should increase your heart rate and make you sweat (moderate-intensity exercise).  Do strengthening exercises at least twice a week. This is in addition to the moderate-intensity exercise.  Spend less time sitting. Even light physical activity can be beneficial. Watch cholesterol and blood lipids Have your blood tested for lipids and cholesterol at 56 years of age, then have this test every 5 years. Have your cholesterol levels checked more often if:  Your lipid or cholesterol levels are high.  You are older than 56 years of age.  You are at high risk for heart disease. What should I know about cancer screening? Depending on your health history and family history, you may need to have cancer screening at various ages. This may include screening for:  Breast cancer.  Cervical cancer.  Colorectal cancer.  Skin cancer.  Lung cancer. What should I know about heart disease, diabetes, and high blood  pressure? Blood pressure and heart disease  High blood pressure causes heart disease and increases the risk of stroke. This is more likely to develop in people who have high blood pressure readings, are of African descent, or are overweight.  Have your blood pressure checked: ? Every 3-5 years if you are 18-39 years of age. ? Every year if you are 56 years old or older. Diabetes Have regular diabetes screenings. This checks your fasting blood sugar level. Have the screening done:  Once every three years after age 56 if you are at a normal weight and have a low risk for diabetes.  More often and at a younger age if you are overweight or have a high risk for diabetes. What should I know about preventing infection? Hepatitis B If you have a higher risk for hepatitis B, you should be screened for this virus. Talk with your health care provider to find out if you are at risk for hepatitis B infection. Hepatitis C Testing is recommended for:  Everyone born from 1945 through 1965.  Anyone with known risk factors for hepatitis C. Sexually transmitted infections (STIs)  Get screened for STIs, including gonorrhea and chlamydia, if: ? You are sexually active and are younger than 56 years of age. ? You are older than 56 years of age and your health care provider tells you that you are at risk for this type of infection. ? Your sexual activity has changed since you were last screened, and you are at increased risk for chlamydia or gonorrhea. Ask your health care provider if   you are at risk.  Ask your health care provider about whether you are at high risk for HIV. Your health care provider may recommend a prescription medicine to help prevent HIV infection. If you choose to take medicine to prevent HIV, you should first get tested for HIV. You should then be tested every 3 months for as long as you are taking the medicine. Pregnancy  If you are about to stop having your period (premenopausal) and  you may become pregnant, seek counseling before you get pregnant.  Take 400 to 800 micrograms (mcg) of folic acid every day if you become pregnant.  Ask for birth control (contraception) if you want to prevent pregnancy. Osteoporosis and menopause Osteoporosis is a disease in which the bones lose minerals and strength with aging. This can result in bone fractures. If you are 56 years old or older, or if you are at risk for osteoporosis and fractures, ask your health care provider if you should:  Be screened for bone loss.  Take a calcium or vitamin D supplement to lower your risk of fractures.  Be given hormone replacement therapy (HRT) to treat symptoms of menopause. Follow these instructions at home: Lifestyle  Do not use any products that contain nicotine or tobacco, such as cigarettes, e-cigarettes, and chewing tobacco. If you need help quitting, ask your health care provider.  Do not use street drugs.  Do not share needles.  Ask your health care provider for help if you need support or information about quitting drugs. Alcohol use  Do not drink alcohol if: ? Your health care provider tells you not to drink. ? You are pregnant, may be pregnant, or are planning to become pregnant.  If you drink alcohol: ? Limit how much you use to 0-1 drink a day. ? Limit intake if you are breastfeeding.  Be aware of how much alcohol is in your drink. In the [place] one drink equals one 12 oz bottle of beer (355 mL), one 5 oz glass of wine (148 mL), or one 1 oz glass of hard liquor (44 mL). General instructions  Schedule regular health, dental, and eye exams.  Stay current with your vaccines.  Tell your health care provider if: ? You often feel depressed. ? You have ever been abused or do not feel safe at home. Summary  Adopting a healthy lifestyle and getting preventive care are important in promoting health and wellness.  Follow your health care provider's instructions about healthy  diet, exercising, and getting tested or screened for diseases.  Follow your health care provider's instructions on monitoring your cholesterol and blood pressure. This information is not intended to replace advice given to you by your health care provider. Make sure you discuss any questions you have with your health care provider. Document Revised: 02/03/2018 Document Reviewed: 02/03/2018 Elsevier Patient Education  [place]. Influenza (Flu) Vaccine (Inactivated or Recombinant): What You Need to Know 1. Why get vaccinated? Influenza vaccine can prevent influenza (flu). Flu is a contagious disease that spreads around the [place] every year, usually between October and May. Anyone can get the flu, but it is more dangerous for some people. Infants and young children, people 14 years of age and older, pregnant women, and people with certain health conditions or a weakened immune system are at greatest risk of flu complications. Pneumonia, bronchitis, sinus infections and ear infections are examples of flu-related complications. If you have a medical condition, such as heart disease, cancer or diabetes, flu  can make it worse. Flu can cause fever and chills, sore throat, muscle aches, fatigue, cough, headache, and runny or stuffy nose. Some people may have vomiting and diarrhea, though this is more common in children than adults. Each year thousands of people in the [place] die from flu, and many more are hospitalized. Flu vaccine prevents millions of illnesses and flu-related visits to the doctor each year. 2. Influenza vaccine CDC recommends everyone 78 months of age and older get vaccinated every flu season. Children 6 months through 36 years of age may need 2 doses during a single flu season. Everyone else needs only 1 dose each flu season. It takes about 2 weeks for protection to develop after vaccination. There are many flu viruses, and they are always changing. Each year a new  flu vaccine is made to protect against three or four viruses that are likely to cause disease in the upcoming flu season. Even when the vaccine doesn't exactly match these viruses, it may still provide some protection. Influenza vaccine does not cause flu. Influenza vaccine may be given at the same time as other vaccines. 3. Talk with your health care provider Tell your vaccine provider if the person getting the vaccine:  Has had an allergic reaction after a previous dose of influenza vaccine, or has any severe, life-threatening allergies.  Has ever had Guillain-Barr Syndrome (also called GBS). In some cases, your health care provider may decide to postpone influenza vaccination to a future visit. People with minor illnesses, such as a cold, may be vaccinated. People who are moderately or severely ill should usually wait until they recover before getting influenza vaccine. Your health care provider can give you more information. 4. Risks of a vaccine reaction  Soreness, redness, and swelling where shot is given, fever, muscle aches, and headache can happen after influenza vaccine.  There may be a very small increased risk of Guillain-Barr Syndrome (GBS) after inactivated influenza vaccine (the flu shot). Young children who get the flu shot along with pneumococcal vaccine (PCV13), and/or DTaP vaccine at the same time might be slightly more likely to have a seizure caused by fever. Tell your health care provider if a child who is getting flu vaccine has ever had a seizure. People sometimes faint after medical procedures, including vaccination. Tell your provider if you feel dizzy or have vision changes or ringing in the ears. As with any medicine, there is a very remote chance of a vaccine causing a severe allergic reaction, other serious injury, or death. 5. What if there is a serious problem? An allergic reaction could occur after the vaccinated person leaves the clinic. If you see signs of a  severe allergic reaction (hives, swelling of the face and throat, difficulty breathing, a fast heartbeat, dizziness, or weakness), call 9-1-1 and get the person to the nearest hospital. For other signs that concern you, call your health care provider. Adverse reactions should be reported to the Vaccine Adverse Event Reporting System (VAERS). Your health care provider will usually file this report, or you can do it yourself. Visit the VAERS website at www.vaers.SamedayNews.es or call 440-469-9103.VAERS is only for reporting reactions, and VAERS staff do not give medical advice. 6. The National Vaccine Injury Compensation Program The [employer] Vaccine Injury Compensation Program (VICP) is a federal program that was created to compensate people who may have been injured by certain vaccines. Visit the VICP website at GoldCloset.com.ee or call (202)136-5822 to learn about the program and about filing a claim.  There is a time limit to file a claim for compensation. 7. How can I learn more?  Ask your healthcare provider.  Call your local or state health department.  Contact the Centers for Disease Control and Prevention (CDC): ? Call 587 525 6892 (1-800-CDC-INFO) or ? Visit CDC's https://gibson.com/ Vaccine Information Statement (Interim) Inactivated Influenza Vaccine (10/08/2017) This information is not intended to replace advice given to you by your health care provider. Make sure you discuss any questions you have with your health care provider. Document Revised: 06/01/2018 Document Reviewed: 10/12/2017 Elsevier Patient Education  [place].

## 2020-01-06 NOTE — Progress Notes (Signed)
BP 96/63    Pulse 66    Temp 98.1 F (36.7 C) (Oral)    Ht 5' 3.27" (1.607 m)    Wt 142 lb 6.4 oz (64.6 kg)    LMP 07/26/2010 (Approximate)    SpO2 98%    BMI 25.01 kg/m    Subjective:    Patient ID: Sabrina Baxter, female    DOB: 11/03/1965, 54 y.o.   MRN: [id number]  HPI: Sabrina Baxter is a 54 y.o. female presenting on 01/06/2020 for comprehensive medical examination. Current medical complaints include:none  She currently lives with: husband Menopausal Symptoms: yes- hot flashes  Depression Screen done today and results listed below:  Depression screen [facility] 2/9 01/06/2020 06/18/2017 12/24/2016  Decreased Interest 0 0 0  Down, Depressed, Hopeless 0 0 0  PHQ - 2 Score 0 0 0  Altered sleeping - - 2  Tired, decreased energy - - 1  Change in appetite - - 0  Feeling bad or failure about yourself  - - 0  Trouble concentrating - - 0  Moving slowly or fidgety/restless - - 0  Suicidal thoughts - - 0  PHQ-9 Score - - 3     Past Medical History:  Past Medical History:  Diagnosis Date   Abnormal thyroid function test    Actinic keratosis    Atypical mole 10/13/2017   Left mid tricep    Atypical mole 01/01/2010   right sup lat calf    Atypical mole 01/01/2010   left post lat thigh above popliteal    Atypical mole 05/09/2009   right scapula    Atypical mole 01/03/2009   xyphoid    Chronic neck pain    Chronic renal failure    worsened with NSAIDs   Disorder of bursae and tendons in shoulder region    Fibroid    54yo   Gastritis    GERD (gastroesophageal reflux disease)    Hyperreflexia    Pelvic pain syndrome    Pudendal neuralgia    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   Uterine fibroid     Surgical History:  Past Surgical History:  Procedure Laterality Date   ABDOMINAL HYSTERECTOMY  2012   hemorrhaging   APPENDECTOMY     COLONOSCOPY WITH PROPOFOL N/A 01/22/2015   Procedure: COLONOSCOPY WITH PROPOFOL;  Surgeon: Lollie Sails, MD;  Location: [facility]  ENDOSCOPY;  Service: Endoscopy;  Laterality: N/A;   ESOPHAGOGASTRODUODENOSCOPY (EGD) WITH PROPOFOL N/A 01/22/2015   Procedure: ESOPHAGOGASTRODUODENOSCOPY (EGD) WITH PROPOFOL;  Surgeon: Lollie Sails, MD;  Location: [facility] ENDOSCOPY;  Service: Endoscopy;  Laterality: N/A;   NISSEN FUNDOPLICATION  [id number]   UTERINE FIBROID SURGERY  1983    Medications:  Current Outpatient Medications on File Prior to Visit  Medication Sig   Cholecalciferol (VITAMIN D3) 2000 UNITS capsule Take by mouth.   cromolyn (OPTICROM) 4 % ophthalmic solution Place 2 drops into both eyes 4 (four) times daily.   Magnesium 250 MG TABS Take by mouth.   Multiple Vitamin (MULTI-DAY) TABS Take by mouth.   pantoprazole (PROTONIX) 20 MG tablet TAKE 1 TABLET BY MOUTH EVERY DAY   No current facility-administered medications on file prior to visit.    Allergies:  Allergies  Allergen Reactions   Amoxicillin    Diclofenac    Mobic [Meloxicam]     Social History:  Social History   Socioeconomic History   Marital status: Married    Spouse name: Not on file   Number of  children: Not on file   Years of education: Not on file   Highest education level: Not on file  Occupational History   Not on file  Tobacco Use   Smoking status: Never Smoker   Smokeless tobacco: Never Used  Vaping Use   Vaping Use: Never used  Substance and Sexual Activity   Alcohol use: No   Drug use: No   Sexual activity: Yes    Birth control/protection: Surgical  Other Topics Concern   Not on file  Social History Narrative   Not on file   Social Determinants of Health   Financial Resource Strain:    Difficulty of Paying Living Expenses: Not on file  Food Insecurity:    Worried About [place] in the Last Year: Not on file   Ran Out of Food in the Last Year: Not on file  Transportation Needs:    Lack of Transportation (Medical): Not on file   Lack of Transportation (Non-Medical): Not on file    Physical Activity:    Days of Exercise per Week: Not on file   Minutes of Exercise per Session: Not on file  Stress:    Feeling of Stress : Not on file  Social Connections:    Frequency of Communication with Friends and Family: Not on file   Frequency of Social Gatherings with Friends and Family: Not on file   Attends Religious Services: Not on file   Active Member of Clubs or Organizations: Not on file   Attends Archivist Meetings: Not on file   Marital Status: Not on file  Intimate Partner Violence:    Fear of Current or Ex-Partner: Not on file   Emotionally Abused: Not on file   Physically Abused: Not on file   Sexually Abused: Not on file   Social History   Tobacco Use  Smoking Status Never Smoker  Smokeless Tobacco Never Used   Social History   Substance and Sexual Activity  Alcohol Use No    Family History:  Family History  Problem Relation Age of Onset   Arthritis Mother        rheumatoid   Depression Mother    Lymphoma Mother    Hypertension Sister    Thyroid disease Brother    Bladder Cancer Brother    Breast cancer Neg Hx     Past medical history, surgical history, medications, allergies, family history and social history reviewed with patient today and changes made to appropriate areas of the chart.   Review of Systems  Constitutional: Positive for diaphoresis. Negative for chills, fever, malaise/fatigue and weight loss.  HENT: Positive for tinnitus (R ear). Negative for congestion, ear discharge, ear pain, hearing loss, nosebleeds, sinus pain and sore throat.   Eyes: Negative.   Respiratory: Negative.  Negative for stridor.   Cardiovascular: Negative.   Gastrointestinal: Negative.   Genitourinary: Negative.   Musculoskeletal: Negative.   Skin: Negative.   Neurological: Negative.   Endo/Heme/Allergies: Negative.   Psychiatric/Behavioral: Negative.     All other ROS negative except what is listed above and in the  HPI.      Objective:    BP 96/63    Pulse 66    Temp 98.1 F (36.7 C) (Oral)    Ht 5' 3.27" (1.607 m)    Wt 142 lb 6.4 oz (64.6 kg)    LMP 07/26/2010 (Approximate)    SpO2 98%    BMI 25.01 kg/m   Wt Readings from Last 3  Encounters:  01/06/20 142 lb 6.4 oz (64.6 kg)  06/18/17 143 lb 9 oz (65.1 kg)  03/16/17 142 lb 2 oz (64.5 kg)    Physical Exam Vitals and nursing note reviewed.  Constitutional:      General: She is not in acute distress.    Appearance: Normal appearance. She is not ill-appearing, toxic-appearing or diaphoretic.  HENT:     Head: Normocephalic and atraumatic.     Right Ear: Tympanic membrane, ear canal and external ear normal. There is no impacted cerumen.     Left Ear: Tympanic membrane, ear canal and external ear normal. There is no impacted cerumen.     Nose: Nose normal. No congestion or rhinorrhea.     Mouth/Throat:     Mouth: Mucous membranes are moist.     Pharynx: Oropharynx is clear. No oropharyngeal exudate or posterior oropharyngeal erythema.  Eyes:     General: No scleral icterus.       Right eye: No discharge.        Left eye: No discharge.     Extraocular Movements: Extraocular movements intact.     Conjunctiva/sclera: Conjunctivae normal.     Pupils: Pupils are equal, round, and reactive to light.  Neck:     Vascular: No carotid bruit.  Cardiovascular:     Rate and Rhythm: Normal rate and regular rhythm.     Pulses: Normal pulses.     Heart sounds: No murmur heard.  No friction rub. No gallop.   Pulmonary:     Effort: Pulmonary effort is normal. No respiratory distress.     Breath sounds: Normal breath sounds. No stridor. No wheezing, rhonchi or rales.  Chest:     Chest wall: No tenderness.  Abdominal:     General: Abdomen is flat. Bowel sounds are normal. There is no distension.     Palpations: Abdomen is soft. There is no mass.     Tenderness: There is no abdominal tenderness. There is no right CVA tenderness, left CVA tenderness,  guarding or rebound.     Hernia: No hernia is present.  Genitourinary:    Comments: Breast and pelvic exams deferred with shared decision making Musculoskeletal:        General: No swelling, tenderness, deformity or signs of injury.     Cervical back: Normal range of motion and neck supple. No rigidity. No muscular tenderness.     Right lower leg: No edema.     Left lower leg: No edema.  Lymphadenopathy:     Cervical: No cervical adenopathy.  Skin:    General: Skin is warm and dry.     Capillary Refill: Capillary refill takes less than 2 seconds.     Coloration: Skin is not jaundiced or pale.     Findings: No bruising, erythema, lesion or rash.  Neurological:     General: No focal deficit present.     Mental Status: She is alert and oriented to person, place, and time. Mental status is at baseline.     Cranial Nerves: No cranial nerve deficit.     Sensory: No sensory deficit.     Motor: No weakness.     Coordination: Coordination normal.     Gait: Gait normal.     Deep Tendon Reflexes: Reflexes normal.  Psychiatric:        Mood and Affect: Mood normal.        Behavior: Behavior normal.        Thought Content: Thought content normal.  Judgment: Judgment normal.     Results for orders placed or performed in visit on 06/18/17  CBC with Differential/Platelet  Result Value Ref Range   WBC 7.5 3.4 - 10.8 x10E3/uL   RBC 4.43 3.77 - 5.28 x10E6/uL   Hemoglobin 13.3 11.1 - 15.9 g/dL   Hematocrit 42.2 34.0 - 46.6 %   MCV 95 79 - 97 fL   MCH 30.0 26.6 - 33.0 pg   MCHC 31.5 31 - 35 g/dL   RDW 13.0 12.3 - 15.4 %   Platelets 204 150 - 379 x10E3/uL   Neutrophils 51 Not Estab. %   Lymphs 42 Not Estab. %   Monocytes 5 Not Estab. %   Eos 2 Not Estab. %   Basos 0 Not Estab. %   Neutrophils Absolute 3.8 1.40 - 7.00 x10E3/uL   Lymphocytes Absolute 3.2 (H) 0 - 3 x10E3/uL   Monocytes Absolute 0.4 0 - 0 x10E3/uL   EOS (ABSOLUTE) 0.1 0.0 - 0.4 x10E3/uL   Basophils Absolute 0.0 0 - 0  x10E3/uL   Immature Granulocytes 0 Not Estab. %   Immature Grans (Abs) 0.0 0.0 - 0.1 x10E3/uL  Comprehensive metabolic panel  Result Value Ref Range   Glucose 77 65 - 99 mg/dL   BUN 26 (H) 6 - 24 mg/dL   Creatinine, Ser 1.28 (H) 0.57 - 1.00 mg/dL   GFR calc non Af Amer 48 (L) >59 mL/min/1.73   GFR calc Af Amer 55 (L) >59 mL/min/1.73   BUN/Creatinine Ratio 20 9 - 23   Sodium 141 134 - 144 mmol/L   Potassium 4.5 3.5 - 5.2 mmol/L   Chloride 100 96 - 106 mmol/L   CO2 25 20 - 29 mmol/L   Calcium 9.3 8.7 - 10.2 mg/dL   Total Protein 6.6 6.0 - 8.5 g/dL   Albumin 4.7 3.5 - 5.5 g/dL   Globulin, Total 1.9 1.5 - 4.5 g/dL   Albumin/Globulin Ratio 2.5 (H) 1.2 - 2.2   Bilirubin Total 0.2 0.0 - 1.2 mg/dL   Alkaline Phosphatase 93 39 - 117 IU/L   AST 24 0 - 40 IU/L   ALT 20 0 - 32 IU/L  Lipid Panel w/o Chol/HDL Ratio  Result Value Ref Range   Cholesterol, Total 224 (H) 100 - 199 mg/dL   Triglycerides 106 0 - 149 mg/dL   HDL 86 >39 mg/dL   VLDL Cholesterol Cal 21 5 - 40 mg/dL   LDL Calculated 117 (H) 0 - 99 mg/dL  Thyroid Panel With TSH  Result Value Ref Range   TSH 4.330 0.450 - 4.500 uIU/mL   T4, Total 5.9 4.5 - 12.0 ug/dL   T3 Uptake Ratio 26 24 - 39 %   Free Thyroxine Index 1.5 1.2 - 4.9  UA/M w/rflx Culture, Routine   Specimen: Genital   GENITAL  Result Value Ref Range   Specific Gravity, UA <1.005 (L) 1.005 - 1.030   pH, UA 5.5 5.0 - 7.5   Color, UA Yellow Yellow   Appearance Ur Clear Clear   Leukocytes, UA Negative Negative   Protein, UA Negative Negative/Trace   Glucose, UA Negative Negative   Ketones, UA Negative Negative   RBC, UA Negative Negative   Bilirubin, UA Negative Negative   Urobilinogen, Ur 0.2 0.2 - 1.0 mg/dL   Nitrite, UA Negative Negative  VITAMIN D 25 Hydroxy (Vit-D Deficiency, Fractures)  Result Value Ref Range   Vit D, 25-Hydroxy 52.9 30.0 - 100.0 ng/mL  IGP, Aptima HPV, rfx 16/18,45  Result Value Ref Range   DIAGNOSIS: Comment    Specimen adequacy:  Comment    Clinician Provided ICD10 Comment    Performed by: Comment    PAP Smear Comment .    Note: Comment    Test Methodology Comment    HPV Aptima Negative Negative      Assessment & Plan:   Problem List Items Addressed This Visit    None    Visit Diagnoses    Routine general medical examination at a health care facility    -  Primary   Vaccines up to date. Screening labs checked today. Pap, mammo, colonoscopy up to date. Continue diet and exercise. Call with any concerns.    Relevant Orders   CBC with Differential/Platelet   Comprehensive metabolic panel   Lipid Panel w/o Chol/HDL Ratio   TSH   Urinalysis, Routine w reflex microscopic   Hepatitis C Antibody   HIV Antibody (routine testing w rflx)   Need for prophylactic vaccination with tetanus-diphtheria (Td)       Will come back for it in the next couple of months.    Relevant Orders   Td : Tetanus/diphtheria >7yo Preservative  free   Encounter for screening mammogram for malignant neoplasm of breast       Mammogram ordered today.    Relevant Orders   MM 3D SCREEN BREAST BILATERAL   Need for influenza vaccination       Flu shot given today.   Relevant Orders   Flu Vaccine QUAD 6+ mos PF IM (Fluarix Quad PF) (Completed)       Follow up plan: Return in about 1 year (around 01/05/2021) for Physical.   LABORATORY TESTING:  - Pap smear: up to date  IMMUNIZATIONS:   - Tdap: Tetanus vaccination status reviewed: last tetanus booster within 10 years. - Influenza: Administered today - Pneumovax: Not applicable  - Prevnar: Not applicable - COVID: Up to date  SCREENING: -Mammogram: Up to date  - Colonoscopy: Up to date   PATIENT COUNSELING:   Advised to take 1 mg of folate supplement per day if capable of pregnancy.   Sexuality: Discussed sexually transmitted diseases, partner selection, use of condoms, avoidance of unintended pregnancy  and contraceptive alternatives.   Advised to avoid cigarette  smoking.  I discussed with the patient that most people either abstain from alcohol or drink within safe limits (<=14/week and <=4 drinks/occasion for males, <=7/weeks and <= 3 drinks/occasion for females) and that the risk for alcohol disorders and other health effects rises proportionally with the number of drinks per week and how often a drinker exceeds daily limits.  Discussed cessation/primary prevention of drug use and availability of treatment for abuse.   Diet: Encouraged to adjust caloric intake to maintain  or achieve ideal body weight, to reduce intake of dietary saturated fat and total fat, to limit sodium intake by avoiding high sodium foods and not adding table salt, and to maintain adequate dietary potassium and calcium preferably from fresh fruits, vegetables, and low-fat dairy products.    stressed the importance of regular exercise  Injury prevention: Discussed safety belts, safety helmets, smoke detector, smoking near bedding or upholstery.   Dental health: Discussed importance of regular tooth brushing, flossing, and dental visits.    NEXT PREVENTATIVE PHYSICAL DUE IN 1 YEAR. Return in about 1 year (around 01/05/2021) for Physical.

## 2020-01-07 LAB — CBC WITH DIFFERENTIAL/PLATELET
Basophils Absolute: 0.1 10*3/uL (ref 0.0–0.2)
Basos: 1 %
EOS (ABSOLUTE): 0.1 10*3/uL (ref 0.0–0.4)
Eos: 2 %
Hematocrit: 43.5 % (ref 34.0–46.6)
Hemoglobin: 13.8 g/dL (ref 11.1–15.9)
Immature Grans (Abs): 0 10*3/uL (ref 0.0–0.1)
Immature Granulocytes: 0 %
Lymphocytes Absolute: 3.5 10*3/uL — ABNORMAL HIGH (ref 0.7–3.1)
Lymphs: 51 %
MCH: 30.4 pg (ref 26.6–33.0)
MCHC: 31.7 g/dL (ref 31.5–35.7)
MCV: 96 fL (ref 79–97)
Monocytes Absolute: 0.4 10*3/uL (ref 0.1–0.9)
Monocytes: 6 %
Neutrophils Absolute: 2.7 10*3/uL (ref 1.4–7.0)
Neutrophils: 40 %
Platelets: 233 10*3/uL (ref 150–450)
RBC: 4.54 x10E6/uL (ref 3.77–5.28)
RDW: 12 % (ref 11.7–15.4)
WBC: 6.9 10*3/uL (ref 3.4–10.8)

## 2020-01-07 LAB — LIPID PANEL W/O CHOL/HDL RATIO
Cholesterol, Total: 254 mg/dL — ABNORMAL HIGH (ref 100–199)
HDL: 76 mg/dL (ref >39)
LDL Chol Calc (NIH): 160 mg/dL — ABNORMAL HIGH (ref 0–99)
Triglycerides: 102 mg/dL (ref 0–149)
VLDL Cholesterol Cal: 18 mg/dL (ref 5–40)

## 2020-01-07 LAB — COMPREHENSIVE METABOLIC PANEL
ALT: 17 IU/L (ref 0–32)
AST: 19 IU/L (ref 0–40)
Albumin/Globulin Ratio: 2.4 — ABNORMAL HIGH (ref 1.2–2.2)
Albumin: 4.7 g/dL (ref 3.8–4.9)
Alkaline Phosphatase: 85 IU/L (ref 44–121)
BUN/Creatinine Ratio: 19 (ref 9–23)
BUN: 20 mg/dL (ref 6–24)
Bilirubin Total: 0.4 mg/dL (ref 0.0–1.2)
CO2: 26 mmol/L (ref 20–29)
Calcium: 9.7 mg/dL (ref 8.7–10.2)
Chloride: 103 mmol/L (ref 96–106)
Creatinine, Ser: 1.08 mg/dL — ABNORMAL HIGH (ref 0.57–1.00)
GFR calc Af Amer: 66 mL/min/{1.73_m2} (ref >59)
GFR calc non Af Amer: 58 mL/min/{1.73_m2} — ABNORMAL LOW (ref >59)
Globulin, Total: 2 g/dL (ref 1.5–4.5)
Glucose: 76 mg/dL (ref 65–99)
Potassium: 4.8 mmol/L (ref 3.5–5.2)
Sodium: 144 mmol/L (ref 134–144)
Total Protein: 6.7 g/dL (ref 6.0–8.5)

## 2020-01-07 LAB — TSH: TSH: 3.07 u[IU]/mL (ref 0.450–4.500)

## 2020-01-07 LAB — HEPATITIS C ANTIBODY: Hep C Virus Ab: <0.1 s/co ratio (ref 0.0–0.9)

## 2020-02-15 ENCOUNTER — Ambulatory Visit: Admitting: Family Medicine

## 2020-02-15 ENCOUNTER — Other Ambulatory Visit: Payer: Self-pay

## 2020-02-15 ENCOUNTER — Encounter: Payer: Self-pay | Admitting: Family Medicine

## 2020-02-15 VITALS — BP 112/77 | HR 66 | Temp 98.0°F | Wt 143.0 lb

## 2020-02-15 DIAGNOSIS — M9909 Segmental and somatic dysfunction of abdomen and other regions: Secondary | ICD-10-CM

## 2020-02-15 DIAGNOSIS — M9903 Segmental and somatic dysfunction of lumbar region: Secondary | ICD-10-CM

## 2020-02-15 DIAGNOSIS — M9908 Segmental and somatic dysfunction of rib cage: Secondary | ICD-10-CM

## 2020-02-15 DIAGNOSIS — M25551 Pain in right hip: Secondary | ICD-10-CM

## 2020-02-15 DIAGNOSIS — M9904 Segmental and somatic dysfunction of sacral region: Secondary | ICD-10-CM

## 2020-02-15 DIAGNOSIS — M9906 Segmental and somatic dysfunction of lower extremity: Secondary | ICD-10-CM

## 2020-02-15 DIAGNOSIS — M9905 Segmental and somatic dysfunction of pelvic region: Secondary | ICD-10-CM

## 2020-02-15 NOTE — Patient Instructions (Signed)
Iliotibial Band Syndrome Rehab Ask your health care provider which exercises are safe for you. Do exercises exactly as told by your health care provider and adjust them as directed. It is normal to feel mild stretching, pulling, tightness, or discomfort as you do these exercises. Stop right away if you feel sudden pain or your pain gets significantly worse. Do not begin these exercises until told by your health care provider. Stretching and range-of-motion exercises These exercises warm up your muscles and joints and improve the movement and flexibility of your hip and pelvis. Quadriceps stretch, prone  1. Lie on your abdomen on a firm surface, such as a bed or padded floor (prone position). 2. Bend your left / right knee and reach back to hold your ankle or pant leg. If you cannot reach your ankle or pant leg, loop a belt around your foot and grab the belt instead. 3. Gently pull your heel toward your buttocks. Your knee should not slide out to the side. You should feel a stretch in the front of your thigh and knee (quadriceps). 4. Hold this position for __________ seconds. Repeat __________ times. Complete this exercise __________ times a day. Iliotibial band stretch An iliotibial band is a strong band of muscle tissue that runs from the outer side of your hip to the outer side of your thigh and knee. 1. Lie on your side with your left / right leg in the top position. 2. Bend both of your knees and grab your left / right ankle. Stretch out your bottom arm to help you balance. 3. Slowly bring your top knee back so your thigh goes behind your trunk. 4. Slowly lower your top leg toward the floor until you feel a gentle stretch on the outside of your left / right hip and thigh. If you do not feel a stretch and your knee will not fall farther, place the heel of your other foot on top of your knee and pull your knee down toward the floor with your foot. 5. Hold this position for __________  seconds. Repeat __________ times. Complete this exercise __________ times a day. Strengthening exercises These exercises build strength and endurance in your hip and pelvis. Endurance is the ability to use your muscles for a long time, even after they get tired. Straight leg raises, side-lying This exercise strengthens the muscles that rotate the leg at the hip and move it away from your body (hip abductors). 1. Lie on your side with your left / right leg in the top position. Lie so your head, shoulder, hip, and knee line up. You may bend your bottom knee to help you balance. 2. Roll your hips slightly forward so your hips are stacked directly over each other and your left / right knee is facing forward. 3. Tense the muscles in your outer thigh and lift your top leg 4-6 inches (10-15 cm). 4. Hold this position for __________ seconds. 5. Slowly return to the starting position. Let your muscles relax completely before doing another repetition. Repeat __________ times. Complete this exercise __________ times a day. Leg raises, prone This exercise strengthens the muscles that move the hips (hip extensors). 1. Lie on your abdomen on your bed or a firm surface. You can put a pillow under your hips if that is more comfortable for your lower back. 2. Bend your left / right knee so your foot is straight up in the air. 3. Squeeze your buttocks muscles and lift your left / right thigh   off the bed. Do not let your back arch. 4. Tense your thigh muscle as hard as you can without increasing any knee pain. 5. Hold this position for __________ seconds. 6. Slowly lower your leg to the starting position and allow it to relax completely. Repeat __________ times. Complete this exercise __________ times a day. Hip hike 1. Stand sideways on a bottom step. Stand on your left / right leg with your other foot unsupported next to the step. You can hold on to the railing or wall for balance if needed. 2. Keep your knees  straight and your torso square. Then lift your left / right hip up toward the ceiling. 3. Slowly let your left / right hip lower toward the floor, past the starting position. Your foot should get closer to the floor. Do not lean or bend your knees. Repeat __________ times. Complete this exercise __________ times a day. This information is not intended to replace advice given to you by your health care provider. Make sure you discuss any questions you have with your health care provider. Document Revised: 06/03/2018 Document Reviewed: 12/02/2017 [employer] Patient Education  2020 [employer]  

## 2020-02-15 NOTE — Progress Notes (Signed)
BP 112/77   Pulse 66   Temp 98 F (36.7 C)   Wt 143 lb (64.9 kg)   LMP 07/26/2010 (Approximate)   SpO2 98%   BMI 25.12 kg/m    Subjective:    Patient ID: Sabrina Baxter, female    DOB: [birth year], 54 y.o.   MRN: [id number]  HPI: Sabrina Baxter is a 54 y.o. female  Chief Complaint  Patient presents with  . OMM   Sabrina Baxter presents today for evaluation and possible treatment with OMT. She notes that she has been having some pain in her R hip and knee since this summer right before changing her shoes. She has a foot that turns out on the R and feels like it changes her gait a bit. This usually resolves with changing her shoes, but this time it did not. Her pain is aching and sore in nature and pretty mild, it will keep her awake at night occasionally, but it;s not shooting or severe. Nothing debilitating, but annoying and uncomfortable. No radiation. Stretching makes it better. Nothing really makes it worse. She is interested to see if OMT can help. She is otherwise feeling well with no other concerns or complaints at this time.   Relevant past medical, surgical, family and social history reviewed and updated as indicated. Interim medical history since our last visit reviewed. Allergies and medications reviewed and updated.  Review of Systems  Constitutional: Negative.   Respiratory: Negative.   Cardiovascular: Negative.   Gastrointestinal: Negative.   Musculoskeletal: Positive for arthralgias and myalgias. Negative for back pain, gait problem, joint swelling, neck pain and neck stiffness.  Skin: Negative.   Neurological: Negative.   Psychiatric/Behavioral: Negative.     Per HPI unless specifically indicated above     Objective:    BP 112/77   Pulse 66   Temp 98 F (36.7 C)   Wt 143 lb (64.9 kg)   LMP 07/26/2010 (Approximate)   SpO2 98%   BMI 25.12 kg/m   Wt Readings from Last 3 Encounters:  02/15/20 143 lb (64.9 kg)  01/06/20 142 lb 6.4 oz (64.6 kg)  06/18/17 143 lb  9 oz (65.1 kg)    Physical Exam Vitals and nursing note reviewed.  Constitutional:      General: She is not in acute distress.    Appearance: Normal appearance. She is not ill-appearing.  HENT:     Head: Normocephalic and atraumatic.     Right Ear: External ear normal.     Left Ear: External ear normal.     Nose: Nose normal.     Mouth/Throat:     Mouth: Mucous membranes are moist.     Pharynx: Oropharynx is clear.  Eyes:     Extraocular Movements: Extraocular movements intact.     Conjunctiva/sclera: Conjunctivae normal.     Pupils: Pupils are equal, round, and reactive to light.  Neck:     Vascular: No carotid bruit.  Cardiovascular:     Rate and Rhythm: Normal rate.     Pulses: Normal pulses.  Pulmonary:     Effort: Pulmonary effort is normal. No respiratory distress.  Abdominal:     General: Abdomen is flat. There is no distension.     Palpations: Abdomen is soft. There is no mass.     Tenderness: There is no abdominal tenderness. There is no right CVA tenderness, left CVA tenderness, guarding or rebound.     Hernia: No hernia is present.  Musculoskeletal:     Cervical  back: No muscular tenderness.  Lymphadenopathy:     Cervical: No cervical adenopathy.  Skin:    General: Skin is warm and dry.     Capillary Refill: Capillary refill takes less than 2 seconds.     Coloration: Skin is not jaundiced or pale.     Findings: No bruising, erythema, lesion or rash.  Neurological:     General: No focal deficit present.     Mental Status: She is alert. Mental status is at baseline.  Psychiatric:        Mood and Affect: Mood normal.        Behavior: Behavior normal.        Thought Content: Thought content normal.        Judgment: Judgment normal.   Musculoskeletal:  Exam found Decreased ROM, Tissue texture changes, Tenderness to palpation and Asymmetry of patient's  ribs, lumbar, pelvis, sacrum, lower extremity and abdomen Osteopathic Structural Exam:   Ribs: Rib 8 locked  up on the R, Rib 7 locked up on the L  Lumbar: QL hypertonic on the R, psoas hypertonic on the R, L3-5SLRR   Pelvis: Anterior R innominate, SI joint restricted on the R  Sacrum: L on L torsion  Lower Extremity: IT band hypertonic on the R, R posterior fibular head, fascial strain from R foot into leg and into R hip  Abdomen: diaphragm hypertonic bilaterally R>L   Results for orders placed or performed in visit on 01/06/20  CBC with Differential/Platelet  Result Value Ref Range   WBC 6.9 3.4 - 10.8 x10E3/uL   RBC 4.54 3.77 - 5.28 x10E6/uL   Hemoglobin 13.8 11.1 - 15.9 g/dL   Hematocrit 43.5 34.0 - 46.6 %   MCV 96 79 - 97 fL   MCH 30.4 26.6 - 33.0 pg   MCHC 31.7 31.5 - 35.7 g/dL   RDW 12.0 11.7 - 15.4 %   Platelets 233 150 - 450 x10E3/uL   Neutrophils 40 Not Estab. %   Lymphs 51 Not Estab. %   Monocytes 6 Not Estab. %   Eos 2 Not Estab. %   Basos 1 Not Estab. %   Neutrophils Absolute 2.7 1.4 - 7.0 x10E3/uL   Lymphocytes Absolute 3.5 (H) 0.7 - 3.1 x10E3/uL   Monocytes Absolute 0.4 0.1 - 0.9 x10E3/uL   EOS (ABSOLUTE) 0.1 0.0 - 0.4 x10E3/uL   Basophils Absolute 0.1 0.0 - 0.2 x10E3/uL   Immature Granulocytes 0 Not Estab. %   Immature Grans (Abs) 0.0 0.0 - 0.1 x10E3/uL  Comprehensive metabolic panel  Result Value Ref Range   Glucose 76 65 - 99 mg/dL   BUN 20 6 - 24 mg/dL   Creatinine, Ser 1.08 (H) 0.57 - 1.00 mg/dL   GFR calc non Af Amer 58 (L) >59 mL/min/1.73   GFR calc Af Amer 66 >59 mL/min/1.73   BUN/Creatinine Ratio 19 9 - 23   Sodium 144 134 - 144 mmol/L   Potassium 4.8 3.5 - 5.2 mmol/L   Chloride 103 96 - 106 mmol/L   CO2 26 20 - 29 mmol/L   Calcium 9.7 8.7 - 10.2 mg/dL   Total Protein 6.7 6.0 - 8.5 g/dL   Albumin 4.7 3.8 - 4.9 g/dL   Globulin, Total 2.0 1.5 - 4.5 g/dL   Albumin/Globulin Ratio 2.4 (H) 1.2 - 2.2   Bilirubin Total 0.4 0.0 - 1.2 mg/dL   Alkaline Phosphatase 85 44 - 121 IU/L   AST 19 0 - 40 IU/L   ALT 17  0 - 32 IU/L  Lipid Panel w/o Chol/HDL Ratio   Result Value Ref Range   Cholesterol, Total 254 (H) 100 - 199 mg/dL   Triglycerides 102 0 - 149 mg/dL   HDL 76 >39 mg/dL   VLDL Cholesterol Cal 18 5 - 40 mg/dL   LDL Chol Calc (NIH) 160 (H) 0 - 99 mg/dL  TSH  Result Value Ref Range   TSH 3.070 0.450 - 4.500 uIU/mL  Urinalysis, Routine w reflex microscopic  Result Value Ref Range   Specific Gravity, UA 1.020 1.005 - 1.030   pH, UA 5.5 5.0 - 7.5   Color, UA Yellow Yellow   Appearance Ur Clear Clear   Leukocytes,UA Negative Negative   Protein,UA Negative Negative/Trace   Glucose, UA Negative Negative   Ketones, UA Negative Negative   RBC, UA Negative Negative   Bilirubin, UA Negative Negative   Urobilinogen, Ur 0.2 0.2 - 1.0 mg/dL   Nitrite, UA Negative Negative  Hepatitis C Antibody  Result Value Ref Range   Hep C Virus Ab <0.1 0.0 - 0.9 s/co ratio      Assessment & Plan:   Problem List Items Addressed This Visit   None   Visit Diagnoses    Right hip pain    -  Primary   Seems to be myofascial in nature due to gait and possibly GI issues. She does have somatic dysfunction contributing to symptoms. Treated today as below.    Rib cage dysfunction       Segmental dysfunction of abdomen       Somatic dysfunction of lower extremities       Somatic dysfunction of pelvis region       Somatic dysfunction of sacral region       Somatic dysfunction of lumbar region         After verbal consent was obtained, patient was treated today with osteopathic manipulative medicine to the regions of the ribs, lumbar, pelvis, sacrum, abdomen and lower extremity using the techniques of myofascial release, counterstrain, muscle energy, HVLA and soft tissue. Areas of compensation relating to her primary pain source also treated. Patient tolerated the procedure well with good objective and good subjective improvement in symptoms. She left the room in good condition. She was advised to stay well hydrated and that she may have some soreness following  the procedure. If not improving or worsening, she will call and come in. Home exercise program of stretches for psoas and IT band discussed and demonstrated today. Patient will do these stretches BID to before the point of pain, and will return for reevaluation  on a PRN basis.   Follow up plan: Return if symptoms worsen or fail to improve.

## 2020-02-16 ENCOUNTER — Ambulatory Visit: Admitting: Family Medicine

## 2020-06-07 ENCOUNTER — Telehealth: Payer: Self-pay

## 2020-06-07 NOTE — Telephone Encounter (Signed)
Copied from [place]. Topic: Referral - Request for Referral >> Jun 07, 2020  8:40 AM Sabrina Baxter wrote: Reason for CRM: Pt received a notification that she is due again for a mammogram / Pt needs a referral to [facility] health in [place] due to her insurance / please advise

## 2020-06-07 NOTE — Telephone Encounter (Signed)
Order in- please send to below. Dx: screening for breast cancer

## 2020-06-07 NOTE — Telephone Encounter (Signed)
Order signed by Dr. Wynetta Emery and faxed to [facility] [place].   Patient notified that this was done for her.

## 2020-06-07 NOTE — Telephone Encounter (Signed)
Order printed. Will fax once signed by provider.

## 2020-08-28 ENCOUNTER — Ambulatory Visit
Admission: RE | Admit: 2020-08-28 | Discharge: 2020-08-28 | Disposition: A | Discharge status: Home / Self Care | Source: Ambulatory Visit | Attending: Family Medicine | Admitting: Family Medicine

## 2020-08-28 ENCOUNTER — Encounter: Payer: Self-pay | Admitting: Family Medicine

## 2020-08-28 ENCOUNTER — Ambulatory Visit: Admitting: Family Medicine

## 2020-08-28 ENCOUNTER — Other Ambulatory Visit: Payer: Self-pay

## 2020-08-28 ENCOUNTER — Ambulatory Visit
Admission: RE | Admit: 2020-08-28 | Discharge: 2020-08-28 | Disposition: A | Discharge status: Home / Self Care | Attending: Family Medicine | Admitting: Family Medicine

## 2020-08-28 VITALS — BP 131/82 | HR 67 | Wt 145.0 lb

## 2020-08-28 DIAGNOSIS — M25551 Pain in right hip: Secondary | ICD-10-CM

## 2020-08-28 MED ORDER — LIDOCAINE 5 % EX OINT: 1.0000 "application " | TOPICAL_OINTMENT | CUTANEOUS | 0 refills | Status: DC | PRN

## 2020-08-28 NOTE — Progress Notes (Signed)
BP 131/82   Pulse 67   Wt 145 lb (65.8 kg)   LMP 07/26/2010 (Approximate)   SpO2 100%   BMI 25.47 kg/m    Subjective:    Patient ID: Sabrina Baxter, female    DOB: 29-Apr-1965, 55 y.o.   MRN: [id number]  HPI: Sabrina Baxter is a 55 y.o. female  Chief Complaint  Patient presents with   Hip Pain    Patient states she has been having right hip pain on and off for about a year    HIP PAIN Duration: chronic- worse in the last year Involved hip: right  Mechanism of injury: unknown Location: anterior Onset: gradual  Severity:  worse with adduction, fluctuates   Quality: "sickening"  Frequency: intermittent Radiation: no Aggravating factors: positional, walking   Alleviating factors: nothing  Status: worse Treatments attempted: rest, ice, heat, APAP, and HEP   Relief with NSAIDs?: No NSAIDs Taken Weakness with weight bearing: no Weakness with walking: no Paresthesias / decreased sensation: no Swelling: no Redness:no Fevers: no   Relevant past medical, surgical, family and social history reviewed and updated as indicated. Interim medical history since our last visit reviewed. Allergies and medications reviewed and updated.  Review of Systems  Constitutional: Negative.   Respiratory: Negative.    Cardiovascular: Negative.   Musculoskeletal:  Positive for arthralgias, gait problem and myalgias. Negative for back pain, joint swelling, neck pain and neck stiffness.  Psychiatric/Behavioral: Negative.     Per HPI unless specifically indicated above     Objective:    BP 131/82   Pulse 67   Wt 145 lb (65.8 kg)   LMP 07/26/2010 (Approximate)   SpO2 100%   BMI 25.47 kg/m   Wt Readings from Last 3 Encounters:  08/28/20 145 lb (65.8 kg)  02/15/20 143 lb (64.9 kg)  01/06/20 142 lb 6.4 oz (64.6 kg)    Physical Exam Vitals and nursing note reviewed.  Constitutional:      General: She is not in acute distress.    Appearance: Normal appearance. She is not ill-appearing,  toxic-appearing or diaphoretic.  HENT:     Head: Normocephalic and atraumatic.     Right Ear: External ear normal.     Left Ear: External ear normal.     Nose: Nose normal.     Mouth/Throat:     Mouth: Mucous membranes are moist.     Pharynx: Oropharynx is clear.  Eyes:     General: No scleral icterus.       Right eye: No discharge.        Left eye: No discharge.     Extraocular Movements: Extraocular movements intact.     Conjunctiva/sclera: Conjunctivae normal.     Pupils: Pupils are equal, round, and reactive to light.  Cardiovascular:     Rate and Rhythm: Normal rate and regular rhythm.     Pulses: Normal pulses.     Heart sounds: Normal heart sounds. No murmur heard.   No friction rub. No gallop.  Pulmonary:     Effort: Pulmonary effort is normal. No respiratory distress.     Breath sounds: Normal breath sounds. No stridor. No wheezing, rhonchi or rales.  Chest:     Chest wall: No tenderness.  Musculoskeletal:        General: Normal range of motion.     Cervical back: Normal range of motion and neck supple.  Skin:    General: Skin is warm and dry.     Capillary Refill: Capillary  refill takes less than 2 seconds.     Coloration: Skin is not jaundiced or pale.     Findings: No bruising, erythema, lesion or rash.  Neurological:     General: No focal deficit present.     Mental Status: She is alert and oriented to person, place, and time. Mental status is at baseline.  Psychiatric:        Mood and Affect: Mood normal.        Behavior: Behavior normal.        Thought Content: Thought content normal.        Judgment: Judgment normal.    Results for orders placed or performed in visit on 01/06/20  CBC with Differential/Platelet  Result Value Ref Range   WBC 6.9 3.4 - 10.8 x10E3/uL   RBC 4.54 3.77 - 5.28 x10E6/uL   Hemoglobin 13.8 11.1 - 15.9 g/dL   Hematocrit 43.5 34.0 - 46.6 %   MCV 96 79 - 97 fL   MCH 30.4 26.6 - 33.0 pg   MCHC 31.7 31.5 - 35.7 g/dL   RDW 12.0  11.7 - 15.4 %   Platelets 233 150 - 450 x10E3/uL   Neutrophils 40 Not Estab. %   Lymphs 51 Not Estab. %   Monocytes 6 Not Estab. %   Eos 2 Not Estab. %   Basos 1 Not Estab. %   Neutrophils Absolute 2.7 1.4 - 7.0 x10E3/uL   Lymphocytes Absolute 3.5 (H) 0.7 - 3.1 x10E3/uL   Monocytes Absolute 0.4 0.1 - 0.9 x10E3/uL   EOS (ABSOLUTE) 0.1 0.0 - 0.4 x10E3/uL   Basophils Absolute 0.1 0.0 - 0.2 x10E3/uL   Immature Granulocytes 0 Not Estab. %   Immature Grans (Abs) 0.0 0.0 - 0.1 x10E3/uL  Comprehensive metabolic panel  Result Value Ref Range   Glucose 76 65 - 99 mg/dL   BUN 20 6 - 24 mg/dL   Creatinine, Ser 1.08 (H) 0.57 - 1.00 mg/dL   GFR calc non Af Amer 58 (L) >59 mL/min/1.73   GFR calc Af Amer 66 >59 mL/min/1.73   BUN/Creatinine Ratio 19 9 - 23   Sodium 144 134 - 144 mmol/L   Potassium 4.8 3.5 - 5.2 mmol/L   Chloride 103 96 - 106 mmol/L   CO2 26 20 - 29 mmol/L   Calcium 9.7 8.7 - 10.2 mg/dL   Total Protein 6.7 6.0 - 8.5 g/dL   Albumin 4.7 3.8 - 4.9 g/dL   Globulin, Total 2.0 1.5 - 4.5 g/dL   Albumin/Globulin Ratio 2.4 (H) 1.2 - 2.2   Bilirubin Total 0.4 0.0 - 1.2 mg/dL   Alkaline Phosphatase 85 44 - 121 IU/L   AST 19 0 - 40 IU/L   ALT 17 0 - 32 IU/L  Lipid Panel w/o Chol/HDL Ratio  Result Value Ref Range   Cholesterol, Total 254 (H) 100 - 199 mg/dL   Triglycerides 102 0 - 149 mg/dL   HDL 76 >39 mg/dL   VLDL Cholesterol Cal 18 5 - 40 mg/dL   LDL Chol Calc (NIH) 160 (H) 0 - 99 mg/dL  TSH  Result Value Ref Range   TSH 3.070 0.450 - 4.500 uIU/mL  Urinalysis, Routine w reflex microscopic  Result Value Ref Range   Specific Gravity, UA 1.020 1.005 - 1.030   pH, UA 5.5 5.0 - 7.5   Color, UA Yellow Yellow   Appearance Ur Clear Clear   Leukocytes,UA Negative Negative   Protein,UA Negative Negative/Trace   Glucose, UA Negative Negative  Ketones, UA Negative Negative   RBC, UA Negative Negative   Bilirubin, UA Negative Negative   Urobilinogen, Ur 0.2 0.2 - 1.0 mg/dL    Nitrite, UA Negative Negative  Hepatitis C Antibody  Result Value Ref Range   Hep C Virus Ab <0.1 0.0 - 0.9 s/co ratio      Assessment & Plan:   Problem List Items Addressed This Visit   None Visit Diagnoses     Right hip pain    -  Primary   Will check x-rays. Await results. Will get her into PT. Recheck 1 month. Call with any concerns.    Relevant Orders   DG Hip Unilat W OR W/O Pelvis 2-3 Views Right   DG Lumbar Spine Complete   Ambulatory referral to Physical Therapy        Follow up plan: Return in about 4 weeks (around 09/25/2020).

## 2020-09-26 ENCOUNTER — Ambulatory Visit: Attending: Family Medicine

## 2020-09-26 ENCOUNTER — Other Ambulatory Visit: Payer: Self-pay

## 2020-09-26 DIAGNOSIS — M6281 Muscle weakness (generalized): Secondary | ICD-10-CM | POA: Diagnosis present

## 2020-09-26 DIAGNOSIS — M25551 Pain in right hip: Secondary | ICD-10-CM | POA: Insufficient documentation

## 2020-09-26 DIAGNOSIS — R2689 Other abnormalities of gait and mobility: Secondary | ICD-10-CM | POA: Diagnosis present

## 2020-09-26 NOTE — Therapy (Signed)
[facility] [address] [place], [place], [address] Phone: [phone]   Fax:  [phone]  Physical Therapy Evaluation  Patient Details  Name: Sabrina Baxter MRN: [id number] Date of Birth: Apr 16, 1965 Referring Provider (PT): Park Liter DO  Encounter Date: 09/26/2020   PT End of Session - 09/27/20 1807     Visit Number 1    Number of Visits 17    Date for PT Re-Evaluation 11/21/20    Authorization Type eval: 09/26/20    PT Start Time 0930    PT Stop Time 1015    PT Time Calculation (min) 45 min    Activity Tolerance Patient tolerated treatment well    Behavior During Therapy [facility] for tasks assessed/performed              Past Medical History:  Diagnosis Date   Abnormal thyroid function test    Actinic keratosis    Atypical mole 10/13/2017   Left mid tricep    Atypical mole 01/01/2010   right sup lat calf    Atypical mole 01/01/2010   left post lat thigh above popliteal    Atypical mole 05/09/2009   right scapula    Atypical mole 01/03/2009   xyphoid    Chronic neck pain    Chronic renal failure    worsened with NSAIDs   Disorder of bursae and tendons in shoulder region    Fibroid    55yo   Gastritis    GERD (gastroesophageal reflux disease)    Hyperreflexia    Pelvic pain syndrome    Pudendal neuralgia    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   Uterine fibroid     Past Surgical History:  Procedure Laterality Date   ABDOMINAL HYSTERECTOMY  2012   hemorrhaging   APPENDECTOMY     COLONOSCOPY WITH PROPOFOL N/A 01/22/2015   Procedure: COLONOSCOPY WITH PROPOFOL;  Surgeon: Lollie Sails, MD;  Location: [facility] ENDOSCOPY;  Service: Endoscopy;  Laterality: N/A;   ESOPHAGOGASTRODUODENOSCOPY (EGD) WITH PROPOFOL N/A 01/22/2015   Procedure: ESOPHAGOGASTRODUODENOSCOPY (EGD) WITH PROPOFOL;  Surgeon: Lollie Sails, MD;  Location: [facility] ENDOSCOPY;  Service: Endoscopy;  Laterality: N/A;   NISSEN FUNDOPLICATION  [id number]    [place]    There were no vitals filed for this visit.    Subjective Assessment - 09/27/20 1806     Subjective R hip pain    Pertinent History Pt reports that last summer she increased her daily walking distance from 3-4 miles. She started having R hip/groin pain afterward. No trauma that she can recall. She reports difficulty with sitting due to her hip pain as well as pain with walking uphill, rocking in her rocker, and hiking. She stretches daily which does help. 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 She used to bike frequently but on December 27, 2012 she started having severe bilateral low back pain which radiated down to her buttocks. She also started having coccyx pain. She went to pelvic health physical therapy which was helpful but eventually she discovered how to stretch and foam roll to ease the pain. Pain has been improving over the last few weeks with R hip flexor strengthening. Lumbar spine radiographs. Lumbar spine radiographs showed no acute osseous abnormality. Interval progression of the lower lumbar predominant degenerative change. R hip radiographs showed no fracture or dislocation of the right hip. Mild right hip degenerative change.    Limitations Walking  Diagnostic tests See history    Patient Stated Goals "I want to hike without pain"    Currently in Pain? Yes    Pain Score 2    Worst: 8/10, Best: 0/10   Pain Location Hip    Pain Orientation Right    Pain Descriptors / Indicators Dull    Pain Type Chronic pain    Pain Radiating Towards Unclear if pain radiates but pt also reports pain down the thigh to the R knee with occasional R knee instability    Pain Onset More than a month ago    Pain Frequency Intermittent    Aggravating Factors  hiking, uphill walking, rocking in rocker, steps, forward bending from either sitting or standing, extended sitting    Pain Relieving Factors standing, stretching (quads, adductors, seated  figure 4), has tried myofascial ball in R hip flexors (no benefit)                  SUBJECTIVE   History: Pt reports that last summer she increased her daily walking distance from 3-4 miles. She started having R hip/groin pain afterward. No trauma that she can recall. She reports difficulty with sitting due to her hip pain as well as pain with walking uphill, rocking in her rocker, and hiking. She stretches daily which does help. 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 She used to bike frequently but on December 27, 2012 she started having severe bilateral low back pain which radiated down to her buttocks. She also started having coccyx pain. She went to pelvic health physical therapy which was helpful but eventually she discovered how to stretch and foam roll to ease the pain. Pain has been improving over the last few weeks with R hip flexor strengthening. Lumbar spine radiographs. Lumbar spine radiographs showed no acute osseous abnormality. Interval progression of the lower lumbar predominant degenerative change. R hip radiographs showed no fracture or dislocation of the right hip. Mild right hip degenerative change.  Referring Dx: R hip pain Pain location: R anterior hip pain Pain: Present 2/10, Best 0/10, Worst 8/10 Pain quality: dull and stabbing at its worst Radiating pain: Yes, down the thigh to the knee (knee feels unstable)  Numbness/Tingling: No 24 hour pain behavior: depends on activity Aggravating factors: hiking, uphill walking, steps, forward bending from either sitting or standing, extended sitting Easing factors: standing, stretching (quads, adductors, seated figure 4), has tried myofascial ball in R hip flexors (no benefit)  History of back/hip injury, pain, surgery, or therapy: Yes history of pelvic therapy. 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  Recent memory is intact.  Remote memory is intact.  Attention span and concentration are intact.  Expressive speech is intact.  Patient's fund of knowledge is within normal limits for educational level.  SENSATION: Grossly intact to light touch bilateral LEs as determined by testing dermatomes L2-S2 Proprioception and hot/cold testing deferred on this date   MUSCULOSKELETAL: Tremor: None Bulk: Normal Tone:  Normal No visible step-off along spinal column  Posture Lumbar lordosis: WNL Iliac crest height: equal bilaterally Lumbar lateral shift: negative  Gait Full gait assessment deferred   Palpation No pain reported to palpation in anterior and lateral hip. She reports pinpoint tenderness near insertion of R hip flexor however no pain reported today during palpation.   Strength (out of 5) R/L 4*/4+ Hip flexion 5*/5 Hip ER 5*/5 Hip IR 4/4 Hip abduction 4+/4+ Hip adduction 4/4 Hip extension 5/5 Knee extension 4+*/4+ Knee flexion 5/5 Ankle dorsiflexion Active bilateral ankle plantarflexion *Indicates pain   AROM (degrees) R/L (all movements include overpressure unless otherwise stated) Lumbar forward flexion (65): 25% loss Lumbar extension (30): 25% loss Lumbar lateral flexion (25): WNL Thoracic and Lumbar rotation (30 degrees): 25% loss bilaterally Hip IR (0-45): R: 30*  L: 40 Hip ER (0-45): R: 70  L: 70 Hip Flexion (0-125): WNL, passive R hip flexion is painful *Indicates pain  Repeated Movements Deferred     Muscle Length Hamstrings: R: 90 degrees* L: 90 degrees  Ely: WNL Thomas: Deferred Ober: WNL, slightly more restricted on L side *indicates pain   Passive Accessory Intervertebral Motion (PAIVM) Pt denies reproduction of back pain with CPA L1-L4 and UPA bilaterally L1-L4. Mildly hypomobile throughout. Low back and R hip pain with bilateral UPA and CPA at L5 but not concordant pain.    SPECIAL TESTS Lumbar Radiculopathy and Discogenic: Centralization and Peripheralization (SN 92, -LR 0.12): Not examined Slump (SN 83, -LR 0.32): R: Negative L: Negative SLR (SN 92, -LR 0.29): R: Negative L:  Negative Crossed SLR (SP 90): R: Negative L: Negative  Facet Joint: Extension-Rotation (SN 100, -LR 0.0): R: Negative L: Negative  Lumbar Foraminal Stenosis: Lumbar quadrant (SN 70): R: Negative L: Negative  Hip: FABER (SN 81): R: Positive L: Negative FADIR (SN 94): R: Positive L: Positive Hip scour (SN 50): R: Negative L: Negative  SIJ:  Thigh Thrust (SN 88, -LR 0.18) : R: Not examined L: Not examined  Piriformis Syndrome: FAIR Test (SN 88, SP 83): R: Not examined L: Not examined            Objective measurements completed on examination: See above findings.                 PT Short Term Goals - 09/27/20 1751       PT SHORT TERM GOAL #1   Title Pt will be independent with HEP in order to improve strength and decrease hip pain in order to improve pain-free function at home and with leisure activity    Time 4    Period Weeks    Status New    Target Date 10/25/20               PT Long Term Goals - 09/27/20 1752       PT LONG TERM GOAL #1   Title Pt will decrease worst R hip pain as reported on NPRS by at least 3 points in order to demonstrate clinically significant reduction in hip pain.    Baseline 09/26/20: worst: 8/10    Time 8    Period Weeks    Status New    Target Date 11/21/20      PT LONG TERM GOAL #2   Title Pt will increase pain-free  bilateral hip abduction and R hip flexion strength to at least 4+/5 in order to improve pelvic stability during gait to decrease R hip pain;    Baseline 09/26/20: bilateral  hip flexion 4/5, R hip flexion 4/5 with pain    Time 8    Period Weeks    Status New    Target Date 11/21/20      PT LONG TERM GOAL #3   Title Pt will increase LEFS by at least 9 points in order to demonstrate significant improvement in lower extremity function.    Baseline 09/26/20: To complete at next visit    Time 8    Period Weeks    Status New    Target Date 11/21/20      PT LONG TERM GOAL #4   Title Pt will increase FOTO to predicted value in order to demonstrate significant improvement in function related to R hip pain    Time 8    Period Weeks    Status New    Target Date 11/21/20                    Plan - 09/27/20 1737     Clinical Impression Statement Pt is a pleasant 55 year-old female referred for R hip pain. Examination is consistent with R hip flexor tendinopathy and less likely R hip impingement. Painful and weak active and resisted R hip flexion during examination. She also has limited and painful R hip internal rotation. Pt presents with deficits in strength, range of motion, and pain and will benefit from skilled PT services to address deficits and return to pain-free function at home and with hiking.    Personal Factors and Comorbidities Comorbidity 2    Comorbidities pudental neuralgia, chronic pelvic pain    Examination-Activity Limitations Squat;Stairs    Examination-Participation Restrictions Community Activity;Other   Hiking   Stability/Clinical Decision Making Evolving/Moderate complexity    Clinical Decision Making Moderate    Rehab Potential Good    PT Frequency 2x / week   once week, every other, every 3 weeks   PT Duration 8 weeks    PT Treatment/Interventions Aquatic Therapy;Cryotherapy;Electrical Stimulation;Functional mobility training;Stair training;Gait training;Moist  Heat;ADLs/Self Care Home Management;Therapeutic activities;Therapeutic exercise;Balance training;Neuromuscular re-education;Patient/family education;Passive range of motion;Scar mobilization;Manual techniques;Dry needling;Energy conservation;Taping;Canalith Repostioning;Iontophoresis '4mg'$ /ml Dexamethasone;Ultrasound;DME Instruction;Cognitive remediation;Vestibular;Spinal Manipulations;Joint Manipulations    PT Next Visit Plan Assess stairs, assess gait without shoes, pt to complete LEFS, initiate HEP (R hip flexor stretch, R hip IR stretch with minimal hip flexion, R hip flexor isometric strengthening)    PT Home Exercise Plan None currently, pt encouraged to decreased/stop active R SLR and initiate light R hip flexor stretch    Consulted and Agree with Plan of Care Patient             Patient will benefit from skilled therapeutic intervention in order to improve the following deficits and impairments:  Decreased strength, Pain, Decreased range of motion  Visit Diagnosis: Pain in right hip  Muscle weakness (generalized)  Other abnormalities of gait and mobility     Problem List Patient Active Problem List   Diagnosis Date Noted   Other specified conditions associated with female genital organs and menstrual cycle 02/14/2015   GERD (gastroesophageal reflux disease)    Chronic renal failure    Pudendal neuralgia    Pelvic pain syndrome    Lyndel Safe Wilkie Zenon PT, DPT, GCS  Melainie Krinsky 09/27/2020, 6:29 PM   [facility] [address] [place], [place], [address] Phone: [phone]   Fax:  [phone]  Name: Giulianna Krenke MRN: [id number] Date of Birth: 1965-04-19

## 2020-09-28 ENCOUNTER — Ambulatory Visit: Admitting: Physical Therapy

## 2020-09-28 ENCOUNTER — Other Ambulatory Visit: Payer: Self-pay

## 2020-09-28 DIAGNOSIS — M6281 Muscle weakness (generalized): Secondary | ICD-10-CM

## 2020-09-28 DIAGNOSIS — M25551 Pain in right hip: Secondary | ICD-10-CM | POA: Diagnosis not present

## 2020-09-28 DIAGNOSIS — R2689 Other abnormalities of gait and mobility: Secondary | ICD-10-CM

## 2020-09-28 NOTE — Therapy (Signed)
[facility] [facility] [address] [place], [place], [address] Phone: [phone]   Fax:  [phone]  Physical Therapy Treatment  Patient Details  Name: Sabrina Baxter MRN: [id number] Date of Birth: 03/18/1965 Referring Provider (PT): Park Liter DO   Encounter Date: 09/28/2020   PT End of Session - 09/28/20 1312     Visit Number 2    Number of Visits 17    Date for PT Re-Evaluation 11/21/20    Authorization Type eval: 09/26/20    PT Start Time 0845    PT Stop Time 0934    PT Time Calculation (min) 49 min    Activity Tolerance Patient tolerated treatment well    Behavior During Therapy [facility] for tasks assessed/performed             Past Medical History:  Diagnosis Date   Abnormal thyroid function test    Actinic keratosis    Atypical mole 10/13/2017   Left mid tricep    Atypical mole 01/01/2010   right sup lat calf    Atypical mole 01/01/2010   left post lat thigh above popliteal    Atypical mole 05/09/2009   right scapula    Atypical mole 01/03/2009   xyphoid    Chronic neck pain    Chronic renal failure    worsened with NSAIDs   Disorder of bursae and tendons in shoulder region    Fibroid    55yo   Gastritis    GERD (gastroesophageal reflux disease)    Hyperreflexia    Pelvic pain syndrome    Pudendal neuralgia    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   Uterine fibroid     Past Surgical History:  Procedure Laterality Date   ABDOMINAL HYSTERECTOMY  2012   hemorrhaging   APPENDECTOMY     COLONOSCOPY WITH PROPOFOL N/A 01/22/2015   Procedure: COLONOSCOPY WITH PROPOFOL;  Surgeon: Lollie Sails, MD;  Location: [facility] ENDOSCOPY;  Service: Endoscopy;  Laterality: N/A;   ESOPHAGOGASTRODUODENOSCOPY (EGD) WITH PROPOFOL N/A 01/22/2015   Procedure: ESOPHAGOGASTRODUODENOSCOPY (EGD) WITH PROPOFOL;  Surgeon: Lollie Sails, MD;  Location: [facility] ENDOSCOPY;  Service: Endoscopy;  Laterality: N/A;   NISSEN FUNDOPLICATION  [id number]    [place]    There were no vitals filed for this visit.   Subjective Assessment - 09/28/20 0847     Subjective Pt states she is doing great today. Denies pain at start of session. She went for a 3 mile walk this morning prior to PT and reports a "tinge" of pain following the walk (no number provided). Pt recounts a sharp pain while getting into the car yesterday (drivers side) - describes the feeling as "bone on bone"; she also reports that the pain did not last. No questions or concerns at this time.    Pertinent History Pt reports that last summer she increased her daily walking distance from 3-4 miles. She started having R hip/groin pain afterward. No trauma that she can recall. She reports difficulty with sitting due to her hip pain as well as pain with walking uphill, rocking in her rocker, and hiking. She stretches daily which does help. 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 She used to bike frequently but on December 27, 2012 she started having severe bilateral low back pain which radiated down to her buttocks. She also started having coccyx pain. She went to pelvic health physical therapy which was helpful  but eventually she discovered how to stretch and foam roll to ease the pain. Pain has been improving over the last few weeks with R hip flexor strengthening. Lumbar spine radiographs. Lumbar spine radiographs showed no acute osseous abnormality. Interval progression of the lower lumbar predominant degenerative change. R hip radiographs showed no fracture or dislocation of the right hip. Mild right hip degenerative change.    Limitations Walking    Diagnostic tests See history    Patient Stated Goals "I want to hike without pain"    Currently in Pain? No/denies    Pain Onset More than a month ago             INTERVENTIONS  Therapeutic Exercise NuStep for warmup, L2-4 for 5 minutes (2 minutes unbilled); Supine SLR x 10; Supine SAQ with  manual resistance by PT,  2 x 10; Supine bridge with 3 second hold, 2 x10; Step ups to 6" step (RLE only for concentric and eccentric lift) 2 x 10; Squats at the bar through pain-free ROM, 2 x 10; Lunges with knee drive at the bar through pain-free ROM, 2 x 10 each side; Standing hip flexor stretch (lunge position), RLE only 2 x 1 minute; Pt educated on and practiced using TB roller to RLE;  HEP was provided - MedBridge: [id number]  Manual Therapy STM utilizing green TB roller to right quad, ITB, glute med;      Pt arrives with excellent motivation to today's therapy session. PT introduced therapeutic exercises for strengthening the right hip flexors and abductors. HEP was created and provided reflecting exercises performed in today's session. Pt did report that she prefers not to use RB and that she has AW at home but is not keen on using those either (in her HEP); PT provided BW exercises to encourage compliance. SLR did aggravate hip pain; exercise was ended after 1 set. All other exercises were performed through pain-free ROM with VC on technique. Pt was taught a new hip flexor stretch 2/2 the yoga stretch she was performing before aggravating hip pain. HS and piriformis stretches were not included in HEP as pt reports she already performs these stretches daily. Pt will benefit from skilled PT services to address deficits and return to pain-free function at home and with hiking.        PT Education - 09/28/20 1311     Education provided Yes    Education Details MedBridge HEP: [id number]    Person(s) Educated Patient    Methods Explanation;Demonstration;Handout    Comprehension Verbalized understanding;Returned demonstration              PT Short Term Goals - 09/27/20 1751       PT SHORT TERM GOAL #1   Title Pt will be independent with HEP in order to improve strength and decrease hip pain in order to improve pain-free function at home and with leisure activity    Time 4     Period Weeks    Status New    Target Date 10/25/20               PT Long Term Goals - 09/27/20 1752       PT LONG TERM GOAL #1   Title Pt will decrease worst R hip pain as reported on NPRS by at least 3 points in order to demonstrate clinically significant reduction in hip pain.    Baseline 09/26/20: worst: 8/10    Time 8    Period Weeks  Status New    Target Date 11/21/20      PT LONG TERM GOAL #2   Title Pt will increase pain-free bilateral hip abduction and R hip flexion strength to at least 4+/5 in order to improve pelvic stability during gait to decrease R hip pain;    Baseline 09/26/20: bilateral hip flexion 4/5, R hip flexion 4/5 with pain    Time 8    Period Weeks    Status New    Target Date 11/21/20      PT LONG TERM GOAL #3   Title Pt will increase LEFS by at least 9 points in order to demonstrate significant improvement in lower extremity function.    Baseline 09/26/20: To complete at next visit    Time 8    Period Weeks    Status New    Target Date 11/21/20      PT LONG TERM GOAL #4   Title Pt will increase FOTO to predicted value in order to demonstrate significant improvement in function related to R hip pain    Time 8    Period Weeks    Status New    Target Date 11/21/20                   Plan - 09/28/20 1339     Clinical Impression Statement Pt arrives with excellent motivation to today's therapy session. PT introduced therapeutic exercises for strengthening the right hip flexors and abductors. HEP was created and provided reflecting exercises performed in today's session. Pt did report that she prefers not to use RB and that she has AW at home but is not keen on using those either (in her HEP); PT provided BW exercises to encourage compliance. SLR did aggravate hip pain; exercise was ended after 1 set. All other exercises were performed through pain-free ROM with VC on technique. Pt was taught a new hip flexor stretch 2/2 the yoga stretch she  was performing before aggravating hip pain. HS and piriformis stretches were not included in HEP as pt reports she already performs these stretches daily. Pt will benefit from skilled PT services to address deficits and return to pain-free function at home and with hiking.    Personal Factors and Comorbidities Comorbidity 2    Comorbidities pudental neuralgia, chronic pelvic pain    Examination-Activity Limitations Squat;Stairs    Examination-Participation Restrictions Community Activity;Other   Hiking   Stability/Clinical Decision Making Evolving/Moderate complexity    Rehab Potential Good    PT Frequency 2x / week   once week, every other, every 3 weeks   PT Duration 8 weeks    PT Treatment/Interventions Aquatic Therapy;Cryotherapy;Electrical Stimulation;Functional mobility training;Stair training;Gait training;Moist Heat;ADLs/Self Care Home Management;Therapeutic activities;Therapeutic exercise;Balance training;Neuromuscular re-education;Patient/family education;Passive range of motion;Scar mobilization;Manual techniques;Dry needling;Energy conservation;Taping;Canalith Repostioning;Iontophoresis '4mg'$ /ml Dexamethasone;Ultrasound;DME Instruction;Cognitive remediation;Vestibular;Spinal Manipulations;Joint Manipulations    PT Next Visit Plan Assess stairs, assess gait without shoes, pt to complete LEFS, initiate HEP (R hip flexor stretch, R hip IR stretch with minimal hip flexion, R hip flexor isometric strengthening)    PT Home Exercise Plan None currently, pt encouraged to decreased/stop active R SLR and initiate light R hip flexor stretch    Consulted and Agree with Plan of Care Patient             Patient will benefit from skilled therapeutic intervention in order to improve the following deficits and impairments:  Decreased strength, Pain, Decreased range of motion  Visit Diagnosis: Pain in right hip  Muscle  weakness (generalized)  Other abnormalities of gait and  mobility     Problem List Patient Active Problem List   Diagnosis Date Noted   Other specified conditions associated with female genital organs and menstrual cycle 02/14/2015   GERD (gastroesophageal reflux disease)    Chronic renal failure    Pudendal neuralgia    Pelvic pain syndrome    Patrina Levering PT, DPT  Ramonita Lab 09/28/2020, 1:40 PM  [place] [facility] [address] [place], [place], [address] Phone: [phone]   Fax:  [phone]  Name: Adamaris Cash MRN: [id number] Date of Birth: Oct 31, 1965

## 2020-09-28 NOTE — Patient Instructions (Signed)
Access Code: [email or web address] URL: [email or web address] Date: 09/28/2020 Prepared by: Patrina Levering  Exercises Supine Bridge - 1 x daily - 7 x weekly - 2 sets - 10 reps - 3 hold Clamshell - 1 x daily - 7 x weekly - 2 sets - 10 reps - 3 hold Mini Squat with Counter Support - 1 x daily - 7 x weekly - 2 sets - 10 reps Runner's Lunge - 1 x daily - 7 x weekly - 2 sets - 10 reps Step Up - 1 x daily - 7 x weekly - 2 sets - 10 reps Standing Hip Flexor Stretch - 1 x daily - 7 x weekly - 2 reps - 30 hold

## 2020-10-03 ENCOUNTER — Other Ambulatory Visit: Payer: Self-pay

## 2020-10-03 ENCOUNTER — Ambulatory Visit

## 2020-10-03 DIAGNOSIS — M25551 Pain in right hip: Secondary | ICD-10-CM | POA: Diagnosis not present

## 2020-10-03 DIAGNOSIS — M6281 Muscle weakness (generalized): Secondary | ICD-10-CM

## 2020-10-03 NOTE — Therapy (Signed)
[place] [facility] [address] [place], [place], [address] Phone: [phone]   Fax:  [phone]  Physical Therapy Treatment  Patient Details  Name: Sabrina Baxter MRN: [id number] Date of Birth: Oct 06, 1965 Referring Provider (PT): Park Liter DO   Encounter Date: 10/03/2020   PT End of Session - 10/03/20 0936     Visit Number 3    Number of Visits 17    Date for PT Re-Evaluation 11/21/20    Authorization Type eval: 09/26/20    PT Start Time 0932    PT Stop Time 1015    PT Time Calculation (min) 43 min    Activity Tolerance Patient tolerated treatment well    Behavior During Therapy [facility] for tasks assessed/performed             Past Medical History:  Diagnosis Date   Abnormal thyroid function test    Actinic keratosis    Atypical mole 10/13/2017   Left mid tricep    Atypical mole 01/01/2010   right sup lat calf    Atypical mole 01/01/2010   left post lat thigh above popliteal    Atypical mole 05/09/2009   right scapula    Atypical mole 01/03/2009   xyphoid    Chronic neck pain    Chronic renal failure    worsened with NSAIDs   Disorder of bursae and tendons in shoulder region    Fibroid    55yo   Gastritis    GERD (gastroesophageal reflux disease)    Hyperreflexia    Pelvic pain syndrome    Pudendal neuralgia    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   Uterine fibroid     Past Surgical History:  Procedure Laterality Date   ABDOMINAL HYSTERECTOMY  2012   hemorrhaging   APPENDECTOMY     COLONOSCOPY WITH PROPOFOL N/A 01/22/2015   Procedure: COLONOSCOPY WITH PROPOFOL;  Surgeon: Lollie Sails, MD;  Location: [facility] ENDOSCOPY;  Service: Endoscopy;  Laterality: N/A;   ESOPHAGOGASTRODUODENOSCOPY (EGD) WITH PROPOFOL N/A 01/22/2015   Procedure: ESOPHAGOGASTRODUODENOSCOPY (EGD) WITH PROPOFOL;  Surgeon: Lollie Sails, MD;  Location: [facility] ENDOSCOPY;  Service: Endoscopy;  Laterality: N/A;   NISSEN FUNDOPLICATION  [id number]    [place]    There were no vitals filed for this visit.   Subjective Assessment - 10/03/20 0934     Subjective Pt states she is doing great today. Denies pain at start of session. She went for a 4 mile walk this morning prior to PT. She notices her hip pain most significantly when she is sitting in the car with the hip flexed. No questions or concerns at this time.    Pertinent History Pt reports that last summer she increased her daily walking distance from 3-4 miles. She started having R hip/groin pain afterward. No trauma that she can recall. She reports difficulty with sitting due to her hip pain as well as pain with walking uphill, rocking in her rocker, and hiking. She stretches daily which does help. 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 She used to bike frequently but on December 27, 2012 she started having severe bilateral low back pain which radiated down to her buttocks. She also started having coccyx pain. She went to pelvic health physical therapy which was helpful but eventually she discovered how to stretch and foam roll to ease the pain. Pain has been improving over the last few weeks with  R hip flexor strengthening. Lumbar spine radiographs. Lumbar spine radiographs showed no acute osseous abnormality. Interval progression of the lower lumbar predominant degenerative change. R hip radiographs showed no fracture or dislocation of the right hip. Mild right hip degenerative change.    Limitations Walking    Diagnostic tests See history    Patient Stated Goals "I want to hike without pain"    Pain Onset --                TREATMENT   Therapeutic Exercise NuStep for warmup, L2 for 4 minutes during history (2 minutes unbilled); Supine straight leg isometric flexion holds, increased duration from 10s initially to 30s, performed 5 with 30s rest between reps; Hooklying clams with manual resistance from therapist x 10; Hooklying adductor  squeeze with manual resistance from therapist x 10; Seated isometric R hip flexion with pt provided resistance 30s hold/30s rest x 5; HEP modified and education provided to patient;   Manual Therapy STM to distal iliopsoas insertion as well as iliacus; R hip figure 4 stretch 45s x 2; Supine R hip flexor stretch off side of table 45s x 3; Prone CPA and r UPA, L4-L5, grade I-II, 30s/bout x 3 bouts each per level;   Pt educated throughout session about proper posture and technique with exercises. Improved exercise technique, movement at target joints, use of target muscles after min to mod verbal, visual, tactile cues.    Pt arrives with excellent motivation today. Regressed right hip flexor strengthening to isometric holds.  Continued with hip abduction and adduction strengthening.  Introduced soft tissue mobilization to distal iliopsoas insertion as well as iliacus.  Performed central and right unilateral passive accessory mobilizations to L4-L5 with some reproduction of pain down right lower extremity.  HEP modifications provided today.  Pt will benefit from skilled PT services to address deficits and return to pain-free function at home and with hiking.           PT Short Term Goals - 09/27/20 1751       PT SHORT TERM GOAL #1   Title Pt will be independent with HEP in order to improve strength and decrease hip pain in order to improve pain-free function at home and with leisure activity    Time 4    Period Weeks    Status New    Target Date 10/25/20               PT Long Term Goals - 09/27/20 1752       PT LONG TERM GOAL #1   Title Pt will decrease worst R hip pain as reported on NPRS by at least 3 points in order to demonstrate clinically significant reduction in hip pain.    Baseline 09/26/20: worst: 8/10    Time 8    Period Weeks    Status New    Target Date 11/21/20      PT LONG TERM GOAL #2   Title Pt will increase pain-free bilateral hip abduction and R hip  flexion strength to at least 4+/5 in order to improve pelvic stability during gait to decrease R hip pain;    Baseline 09/26/20: bilateral hip flexion 4/5, R hip flexion 4/5 with pain    Time 8    Period Weeks    Status New    Target Date 11/21/20      PT LONG TERM GOAL #3   Title Pt will increase LEFS by at least 9 points in order to  demonstrate significant improvement in lower extremity function.    Baseline 09/26/20: To complete at next visit    Time 8    Period Weeks    Status New    Target Date 11/21/20      PT LONG TERM GOAL #4   Title Pt will increase FOTO to predicted value in order to demonstrate significant improvement in function related to R hip pain    Time 8    Period Weeks    Status New    Target Date 11/21/20                   Plan - 10/03/20 [id number]     Clinical Impression Statement Pt arrives with excellent motivation today. Regressed right hip flexor strengthening to isometric holds.  Continued with hip abduction and adduction strengthening.  Introduced soft tissue mobilization to distal iliopsoas insertion as well as iliacus.  Performed central and unilateral passive accessory mobilizations to L4-L5 with some reproduction of pain down right lower extremity.  HEP modifications provided today.  Pt will benefit from skilled PT services to address deficits and return to pain-free function at home and with hiking.    Personal Factors and Comorbidities Comorbidity 2    Comorbidities pudental neuralgia, chronic pelvic pain    Examination-Activity Limitations Squat;Stairs    Examination-Participation Restrictions Community Activity;Other   Hiking   Stability/Clinical Decision Making Evolving/Moderate complexity    Rehab Potential Good    PT Frequency 2x / week   once week, every other, every 3 weeks   PT Duration 8 weeks    PT Treatment/Interventions Aquatic Therapy;Cryotherapy;Electrical Stimulation;Functional mobility training;Stair training;Gait training;Moist  Heat;ADLs/Self Care Home Management;Therapeutic activities;Therapeutic exercise;Balance training;Neuromuscular re-education;Patient/family education;Passive range of motion;Scar mobilization;Manual techniques;Dry needling;Energy conservation;Taping;Canalith Repostioning;Iontophoresis '4mg'$ /ml Dexamethasone;Ultrasound;DME Instruction;Cognitive remediation;Vestibular;Spinal Manipulations;Joint Manipulations    PT Next Visit Plan pt to complete LEFS, review HEP    PT Home Exercise Plan MedBridge: [id number]    Consulted and Agree with Plan of Care Patient             Patient will benefit from skilled therapeutic intervention in order to improve the following deficits and impairments:  Decreased strength, Pain, Decreased range of motion  Visit Diagnosis: Pain in right hip  Muscle weakness (generalized)     Problem List Patient Active Problem List   Diagnosis Date Noted   Other specified conditions associated with female genital organs and menstrual cycle 02/14/2015   GERD (gastroesophageal reflux disease)    Chronic renal failure    Pudendal neuralgia    Pelvic pain syndrome    Lyndel Safe Twisha Vanpelt PT, DPT, GCS  Yida Hyams 10/04/2020, 8:53 AM  [place] [facility] [address] [place], [place], [address] Phone: [phone]   Fax:  [phone]  Name: Jonylah Kirchner MRN: [id number] Date of Birth: [birth year]

## 2020-10-03 NOTE — Patient Instructions (Signed)
Access Code: [email or web address] URL: [email or web address] Date: 10/03/2020 Prepared by: Roxana Hires  Exercises Supine Bridge - 1 x daily - 7 x weekly - 2 sets - 10 reps - 3s hold Clamshell - 1 x daily - 7 x weekly - 2 sets - 10 reps - 3s hold Hooklying Isometric Hip Flexion - 1 x daily - 7 x weekly - 2 sets - 5 reps - 30s hold/30s relax hold Standing Hip Flexor Stretch - 2 x daily - 7 x weekly - 3-5 reps - 45s hold Mini Squat with Counter Support - 1 x daily - 7 x weekly - 2 sets - 10 reps

## 2020-10-05 ENCOUNTER — Ambulatory Visit

## 2020-10-05 ENCOUNTER — Other Ambulatory Visit: Payer: Self-pay

## 2020-10-05 DIAGNOSIS — M25551 Pain in right hip: Secondary | ICD-10-CM | POA: Diagnosis not present

## 2020-10-05 DIAGNOSIS — M6281 Muscle weakness (generalized): Secondary | ICD-10-CM

## 2020-10-05 NOTE — Therapy (Signed)
[place] [facility] [address] [place], [place], [address] Phone: [phone]   Fax:  [phone]  Physical Therapy Treatment  Patient Details  Name: Sabrina Baxter MRN: [id number] Date of Birth: 03-25-1965 Referring Provider (PT): Park Liter DO   Encounter Date: 10/05/2020   PT End of Session - 10/05/20 1418     Visit Number 4    Number of Visits 17    Date for PT Re-Evaluation 11/21/20    Authorization Type eval: 09/26/20    PT Start Time 0845    PT Stop Time 0930    PT Time Calculation (min) 45 min    Activity Tolerance Patient tolerated treatment well    Behavior During Therapy [facility] for tasks assessed/performed              Past Medical History:  Diagnosis Date   Abnormal thyroid function test    Actinic keratosis    Atypical mole 10/13/2017   Left mid tricep    Atypical mole 01/01/2010   right sup lat calf    Atypical mole 01/01/2010   left post lat thigh above popliteal    Atypical mole 05/09/2009   right scapula    Atypical mole 01/03/2009   xyphoid    Chronic neck pain    Chronic renal failure    worsened with NSAIDs   Disorder of bursae and tendons in shoulder region    Fibroid    55yo   Gastritis    GERD (gastroesophageal reflux disease)    Hyperreflexia    Pelvic pain syndrome    Pudendal neuralgia    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   Uterine fibroid     Past Surgical History:  Procedure Laterality Date   ABDOMINAL HYSTERECTOMY  2012   hemorrhaging   APPENDECTOMY     COLONOSCOPY WITH PROPOFOL N/A 01/22/2015   Procedure: COLONOSCOPY WITH PROPOFOL;  Surgeon: Lollie Sails, MD;  Location: [facility] ENDOSCOPY;  Service: Endoscopy;  Laterality: N/A;   ESOPHAGOGASTRODUODENOSCOPY (EGD) WITH PROPOFOL N/A 01/22/2015   Procedure: ESOPHAGOGASTRODUODENOSCOPY (EGD) WITH PROPOFOL;  Surgeon: Lollie Sails, MD;  Location: [facility] ENDOSCOPY;  Service: Endoscopy;  Laterality: N/A;   NISSEN FUNDOPLICATION  [id number]    [place]    There were no vitals filed for this visit.   Subjective Assessment - 10/05/20 0851     Subjective Pt states she is doing well today. Denies pain at start of session. She went for a 2 mile walk this morning prior to PT but has not performed her stretches.  No significant improvement noted in right hip pain since starting therapy. No questions or concerns at this time.    Pertinent History Pt reports that last summer she increased her daily walking distance from 3-4 miles. She started having R hip/groin pain afterward. No trauma that she can recall. She reports difficulty with sitting due to her hip pain as well as pain with walking uphill, rocking in her rocker, and hiking. She stretches daily which does help. 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 She used to bike frequently but on December 27, 2012 she started having severe bilateral low back pain which radiated down to her buttocks. She also started having coccyx pain. She went to pelvic health physical therapy which was helpful but eventually she discovered how to stretch and foam roll to ease the pain. Pain has been improving over the last few weeks  with R hip flexor strengthening. Lumbar spine radiographs. Lumbar spine radiographs showed no acute osseous abnormality. Interval progression of the lower lumbar predominant degenerative change. R hip radiographs showed no fracture or dislocation of the right hip. Mild right hip degenerative change.    Limitations Walking    Diagnostic tests See history    Patient Stated Goals "I want to hike without pain"                TREATMENT   Therapeutic Exercise NuStep for warmup, L2 for 4 minutes during history (2 minutes unbilled); Supine straight leg isometric flexion holds, 30s on/off x 5; Hooklying isometric clams with manual resistance from therapist x 10; Hooklying isometric adductor squeeze with manual resistance from therapist x  10; Eccentric R SLR lowering with therapist lifting leg and patient lowering back to table x10; HEP modified and education provided to patient;   Manual Therapy R hip figure 4 stretch x 45s; Supine R hip flexor stretch off side of table 45s x 2; Right hip long axis distraction with belt assist, grade 3, 30 seconds per bout x 2 bouts; Right hip inferior mobilizations at 90 flexion with belt assist, grade 3, 30 seconds per bout x3 bouts; Right hip inferior mobilizations with hip in FABER position with belt assist, grade 3, 30 seconds per bout x3 bouts; Right hip medial to lateral mobilizations with hip flexed to 45 degrees with belt assist, grade 3, 30 seconds per bout x3 bouts; STM to distal iliopsoas insertion;   Pt educated throughout session about proper posture and technique with exercises. Improved exercise technique, movement at target joints, use of target muscles after min to mod verbal, visual, tactile cues.    Pt arrives with excellent motivation today. Continued with hip abduction and adduction strengthening.  Introduced right hip flexor eccentric strengthening and added to HEP.  Continued soft tissue mobilization to distal iliopsoas insertion and introduced belt assisted right hip mobilizations.  Deferred lumbar mobilizations on this date.  Added right hip flexor eccentric strengthening to HEP.  Pt will benefit from skilled PT services to address deficits and return to pain-free function at home and with hiking.           PT Short Term Goals - 09/27/20 1751       PT SHORT TERM GOAL #1   Title Pt will be independent with HEP in order to improve strength and decrease hip pain in order to improve pain-free function at home and with leisure activity    Time 4    Period Weeks    Status New    Target Date 10/25/20               PT Long Term Goals - 09/27/20 1752       PT LONG TERM GOAL #1   Title Pt will decrease worst R hip pain as reported on NPRS by at least 3  points in order to demonstrate clinically significant reduction in hip pain.    Baseline 09/26/20: worst: 8/10    Time 8    Period Weeks    Status New    Target Date 11/21/20      PT LONG TERM GOAL #2   Title Pt will increase pain-free bilateral hip abduction and R hip flexion strength to at least 4+/5 in order to improve pelvic stability during gait to decrease R hip pain;    Baseline 09/26/20: bilateral hip flexion 4/5, R hip flexion 4/5 with pain    Time  8    Period Weeks    Status New    Target Date 11/21/20      PT LONG TERM GOAL #3   Title Pt will increase LEFS by at least 9 points in order to demonstrate significant improvement in lower extremity function.    Baseline 09/26/20: To complete at next visit    Time 8    Period Weeks    Status New    Target Date 11/21/20      PT LONG TERM GOAL #4   Title Pt will increase FOTO to predicted value in order to demonstrate significant improvement in function related to R hip pain    Time 8    Period Weeks    Status New    Target Date 11/21/20                   Plan - 10/05/20 1418     Clinical Impression Statement Pt arrives with excellent motivation today. Continued with hip abduction and adduction strengthening.  Introduced right hip flexor eccentric strengthening and added to HEP.  Continued soft tissue mobilization to distal iliopsoas insertion and introduced belt assisted right hip mobilizations.  Deferred lumbar mobilizations on this date.  Added right hip flexor eccentric strengthening to HEP.  Pt will benefit from skilled PT services to address deficits and return to pain-free function at home and with hiking.    Personal Factors and Comorbidities Comorbidity 2    Comorbidities pudental neuralgia, chronic pelvic pain    Examination-Activity Limitations Squat;Stairs    Examination-Participation Restrictions Community Activity;Other   Hiking   Stability/Clinical Decision Making Evolving/Moderate complexity    Rehab  Potential Good    PT Frequency 2x / week   once week, every other, every 3 weeks   PT Duration 8 weeks    PT Treatment/Interventions Aquatic Therapy;Cryotherapy;Electrical Stimulation;Functional mobility training;Stair training;Gait training;Moist Heat;ADLs/Self Care Home Management;Therapeutic activities;Therapeutic exercise;Balance training;Neuromuscular re-education;Patient/family education;Passive range of motion;Scar mobilization;Manual techniques;Dry needling;Energy conservation;Taping;Canalith Repostioning;Iontophoresis '4mg'$ /ml Dexamethasone;Ultrasound;DME Instruction;Cognitive remediation;Vestibular;Spinal Manipulations;Joint Manipulations    PT Next Visit Plan pt to complete LEFS, review HEP    PT Home Exercise Plan MedBridge: [id number]    Consulted and Agree with Plan of Care Patient              Patient will benefit from skilled therapeutic intervention in order to improve the following deficits and impairments:  Decreased strength, Pain, Decreased range of motion  Visit Diagnosis: Pain in right hip  Muscle weakness (generalized)     Problem List Patient Active Problem List   Diagnosis Date Noted   Other specified conditions associated with female genital organs and menstrual cycle 02/14/2015   GERD (gastroesophageal reflux disease)    Chronic renal failure    Pudendal neuralgia    Pelvic pain syndrome    Lyndel Safe Cartrell Bentsen PT, DPT, GCS  Chalsea Darko 10/05/2020, 2:24 PM  [place] [facility] [address] [place], [place], [address] Phone: [phone]   Fax:  [phone]  Name: Jody Mitsch MRN: [id number] Date of Birth: 02-Sep-1965

## 2020-10-10 ENCOUNTER — Ambulatory Visit

## 2020-10-10 ENCOUNTER — Other Ambulatory Visit: Payer: Self-pay

## 2020-10-10 DIAGNOSIS — M25551 Pain in right hip: Secondary | ICD-10-CM | POA: Diagnosis not present

## 2020-10-10 DIAGNOSIS — M6281 Muscle weakness (generalized): Secondary | ICD-10-CM

## 2020-10-10 NOTE — Patient Instructions (Signed)
Access Code: [email or web address] URL: [email or web address] Date: 10/10/2020 Prepared by: Roxana Hires  Exercises Supine Bridge - 1 x daily - 7 x weekly - 2 sets - 10 reps - 3s hold Clamshell - 1 x daily - 7 x weekly - 2 sets - 10 reps - 3s hold Standing Hip Flexor Stretch - 2 x daily - 7 x weekly - 3-5 reps - 45s hold Mini Squat with Counter Support - 1 x daily - 7 x weekly - 2 sets - 10 reps Supine Active Straight Leg Raise - 1 x daily - 7 x weekly - 2 sets - 10 reps - 5-10s lowering (eccentric) hold

## 2020-10-10 NOTE — Therapy (Signed)
[facility] [address] [place], [place], [address] Phone: [phone]   Fax:  [phone]  Physical Therapy Treatment  Patient Details  Name: Sabrina Baxter MRN: [id number] Date of Birth: Oct 02, 1965 Referring Provider (PT): Park Liter DO   Encounter Date: 10/10/2020   PT End of Session - 10/10/20 0938     Visit Number 5    Number of Visits 17    Date for PT Re-Evaluation 11/21/20    Authorization Type eval: 09/26/20    PT Start Time 0933    PT Stop Time 1015    PT Time Calculation (min) 42 min    Activity Tolerance Patient tolerated treatment well    Behavior During Therapy [facility] for tasks assessed/performed              Past Medical History:  Diagnosis Date   Abnormal thyroid function test    Actinic keratosis    Atypical mole 10/13/2017   Left mid tricep    Atypical mole 01/01/2010   right sup lat calf    Atypical mole 01/01/2010   left post lat thigh above popliteal    Atypical mole 05/09/2009   right scapula    Atypical mole 01/03/2009   xyphoid    Chronic neck pain    Chronic renal failure    worsened with NSAIDs   Disorder of bursae and tendons in shoulder region    Fibroid    55yo   Gastritis    GERD (gastroesophageal reflux disease)    Hyperreflexia    Pelvic pain syndrome    Pudendal neuralgia    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 ENDOSCOPY;  Service: Endoscopy;  Laterality: N/A;   NISSEN FUNDOPLICATION  [id number]    [facility]    There were no vitals filed for this visit.   Subjective Assessment - 10/10/20 0935     Subjective Pt states she is doing well today. Denies pain at start of session. She went for a 3.5 mile walk this morning prior to PT with mild irritation but it did not prevent her from finishing her walk. She has noticed less hip pain with extended sitting. No questions or concerns at this time.    Pertinent History Pt reports that last summer she increased her daily walking distance from 3-4 miles. She started having R hip/groin pain afterward. No trauma that she can recall. She reports difficulty with sitting due to her hip pain as well as pain with walking uphill, rocking in her rocker, and hiking. She stretches daily which does help. 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 She used to bike frequently but on December 27, 2012 she started having severe bilateral low back pain which radiated down to her buttocks. She also started having coccyx pain. She went to pelvic health physical therapy which was helpful but eventually she discovered how to stretch and foam roll to ease the pain. Pain has been improving over  the last few weeks with R hip flexor strengthening. Lumbar spine radiographs. Lumbar spine radiographs showed no acute osseous abnormality. Interval progression of the lower lumbar predominant degenerative change. R hip radiographs showed no fracture or dislocation of the right hip. Mild right hip degenerative change.    Limitations Walking    Diagnostic tests See history    Patient Stated Goals "I want to hike without pain"                TREATMENT   Therapeutic Exercise NuStep for warmup, L2 for 4 minutes during history (2 minutes unbilled); Hooklying isometric clams with manual resistance from therapist 10s hold x 10; Hooklying isometric adductor squeeze with manual resistance from therapist 10s hold x 10; Supine straight leg  isometric flexion holds 15s hold x 5; Eccentric R SLR lowering with therapist lifting leg and patient lowering back to table x10;   Manual Therapy R hip figure 4 stretch x 45s; Supine R hip flexor stretch off side of table 45s x 3; Right hip long axis distraction with belt assist, grade 3, 30 seconds per bout x 2 bouts; Right hip inferior mobilizations at 90 flexion with belt assist, grade 3, 30 seconds per bout x3 bouts; Right hip inferior mobilizations with hip in FABER position with belt assist, grade 3, 30 seconds per bout x3 bouts; Right hip medial to lateral mobilizations with hip flexed to 45 degrees with belt assist, grade 3, 30 seconds per bout x3 bouts; STM to distal iliopsoas insertion;   Pt educated throughout session about proper posture and technique with exercises. Improved exercise technique, movement at target joints, use of target muscles after min to mod verbal, visual, tactile cues.    Pt arrives with excellent motivation today. Continued with right hip abduction and adduction strengthening as well as hip flexor eccentric strengthening.  No HEP modifications the exception of removing right hip isometrics in sitting.  Continued soft tissue mobilization to distal iliopsoas insertion as well as belt-assisted right hip mobilizations.  Deferred lumbar mobilizations again on this date.  If patient continues to be able to perform eccentric hip flexor strengthening without an increase in her pain will consider introduction of right hip concentric strengthening in upcoming visits. Pt will benefit from skilled PT services to address deficits and return to pain-free function at home and with hiking.           PT Short Term Goals - 09/27/20 1751       PT SHORT TERM GOAL #1   Title Pt will be independent with HEP in order to improve strength and decrease hip pain in order to improve pain-free function at home and with leisure activity    Time 4    Period Weeks    Status New     Target Date 10/25/20               PT Long Term Goals - 09/27/20 1752       PT LONG TERM GOAL #1   Title Pt will decrease worst R hip pain as reported on NPRS by at least 3 points in order to demonstrate clinically significant reduction in hip pain.    Baseline 09/26/20: worst: 8/10    Time 8    Period Weeks    Status New    Target Date 11/21/20      PT LONG TERM GOAL #2   Title Pt will increase pain-free bilateral hip abduction and R hip flexion strength to at least 4+/5  in order to improve pelvic stability during gait to decrease R hip pain;    Baseline 09/26/20: bilateral hip flexion 4/5, R hip flexion 4/5 with pain    Time 8    Period Weeks    Status New    Target Date 11/21/20      PT LONG TERM GOAL #3   Title Pt will increase LEFS by at least 9 points in order to demonstrate significant improvement in lower extremity function.    Baseline 09/26/20: To complete at next visit    Time 8    Period Weeks    Status New    Target Date 11/21/20      PT LONG TERM GOAL #4   Title Pt will increase FOTO to predicted value in order to demonstrate significant improvement in function related to R hip pain    Time 8    Period Weeks    Status New    Target Date 11/21/20                   Plan - 10/10/20 0939     Clinical Impression Statement Pt arrives with excellent motivation today. Continued with right hip abduction and adduction strengthening as well as hip flexor eccentric strengthening.  No HEP modifications the exception of removing right hip isometrics in sitting.  Continued soft tissue mobilization to distal iliopsoas insertion as well as belt-assisted right hip mobilizations.  Deferred lumbar mobilizations again on this date.  If patient continues to be able to perform eccentric hip flexor strengthening without an increase in her pain will consider introduction of right hip concentric strengthening in upcoming visits. Pt will benefit from skilled PT services to  address deficits and return to pain-free function at home and with hiking.    Personal Factors and Comorbidities Comorbidity 2    Comorbidities pudental neuralgia, chronic pelvic pain    Examination-Activity Limitations Squat;Stairs    Examination-Participation Restrictions Community Activity;Other   Hiking   Stability/Clinical Decision Making Evolving/Moderate complexity    Rehab Potential Good    PT Frequency 2x / week   once week, every other, every 3 weeks   PT Duration 8 weeks    PT Treatment/Interventions Aquatic Therapy;Cryotherapy;Electrical Stimulation;Functional mobility training;Stair training;Gait training;Moist Heat;ADLs/Self Care Home Management;Therapeutic activities;Therapeutic exercise;Balance training;Neuromuscular re-education;Patient/family education;Passive range of motion;Scar mobilization;Manual techniques;Dry needling;Energy conservation;Taping;Canalith Repostioning;Iontophoresis '4mg'$ /ml Dexamethasone;Ultrasound;DME Instruction;Cognitive remediation;Vestibular;Spinal Manipulations;Joint Manipulations    PT Next Visit Plan Continue manual techniques and strengthening for right hip pain, review HEP    PT Home Exercise Plan MedBridge: [id number]    Consulted and Agree with Plan of Care Patient              Patient will benefit from skilled therapeutic intervention in order to improve the following deficits and impairments:  Decreased strength, Pain, Decreased range of motion  Visit Diagnosis: Pain in right hip  Muscle weakness (generalized)     Problem List Patient Active Problem List   Diagnosis Date Noted   Other specified conditions associated with female genital organs and menstrual cycle 02/14/2015   GERD (gastroesophageal reflux disease)    Chronic renal failure    Pudendal neuralgia    Pelvic pain syndrome    Lyndel Safe Tevin Shillingford PT, DPT, GCS  Milda Lindvall 10/10/2020, 11:51 AM  [facility] [address] [place], [place], [address] Phone: [phone]   Fax:  [phone]  Name: Sabrina Baxter MRN: [id number] Date of Birth: [birth year]

## 2020-10-12 ENCOUNTER — Other Ambulatory Visit: Payer: Self-pay

## 2020-10-12 ENCOUNTER — Ambulatory Visit

## 2020-10-12 DIAGNOSIS — M25551 Pain in right hip: Secondary | ICD-10-CM

## 2020-10-12 DIAGNOSIS — M6281 Muscle weakness (generalized): Secondary | ICD-10-CM

## 2020-10-12 NOTE — Therapy (Signed)
[facility] [address] [place], [place], [address] Phone: [phone]   Fax:  [phone]  Physical Therapy Treatment  Patient Details  Name: Sabrina Baxter MRN: [id number] Date of Birth: 06-Aug-1965 Referring Provider (PT): Park Liter DO   Encounter Date: 10/12/2020   PT End of Session - 10/12/20 1315     Visit Number 6    Number of Visits 17    Date for PT Re-Evaluation 11/21/20    Authorization Type eval: 09/26/20    PT Start Time 0930    PT Stop Time 1015    PT Time Calculation (min) 45 min    Activity Tolerance Patient tolerated treatment well    Behavior During Therapy [facility] for tasks assessed/performed               Past Medical History:  Diagnosis Date   Abnormal thyroid function test    Actinic keratosis    Atypical mole 10/13/2017   Left mid tricep    Atypical mole 01/01/2010   right sup lat calf    Atypical mole 01/01/2010   left post lat thigh above popliteal    Atypical mole 05/09/2009   right scapula    Atypical mole 01/03/2009   xyphoid    Chronic neck pain    Chronic renal failure    worsened with NSAIDs   Disorder of bursae and tendons in shoulder region    Fibroid    55yo   Gastritis    GERD (gastroesophageal reflux disease)    Hyperreflexia    Pelvic pain syndrome    Pudendal neuralgia    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   Uterine fibroid     Past Surgical History:  Procedure Laterality Date   ABDOMINAL HYSTERECTOMY  2012   hemorrhaging   APPENDECTOMY     COLONOSCOPY WITH PROPOFOL N/A 01/22/2015   Procedure: COLONOSCOPY WITH PROPOFOL;  Surgeon: Lollie Sails, MD;  Location: [facility] ENDOSCOPY;  Service: Endoscopy;  Laterality: N/A;   ESOPHAGOGASTRODUODENOSCOPY (EGD) WITH PROPOFOL N/A 01/22/2015   Procedure: ESOPHAGOGASTRODUODENOSCOPY (EGD) WITH PROPOFOL;  Surgeon: Lollie Sails, MD;  Location: [facility] ENDOSCOPY;  Service: Endoscopy;  Laterality: N/A;   NISSEN FUNDOPLICATION  [id number]    [place]    There were no vitals filed for this visit.   Subjective Assessment - 10/12/20 0931     Subjective Pt states she is doing well today. Denies pain at start of session. She went for a 3.5 mile walk this morning prior to PT with mild irritation again around 2 mile mark but it did not prevent her from finishing her walk. No questions or concerns at this time.    Pertinent History Pt reports that last summer she increased her daily walking distance from 3-4 miles. She started having R hip/groin pain afterward. No trauma that she can recall. She reports difficulty with sitting due to her hip pain as well as pain with walking uphill, rocking in her rocker, and hiking. She stretches daily which does help. 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 She used to bike frequently but on December 27, 2012 she started having severe bilateral low back pain which radiated down to her buttocks. She also started having coccyx pain. She went to pelvic health physical therapy which was helpful but eventually she discovered how to stretch and foam roll to ease the pain. Pain has been improving over the last few  weeks with R hip flexor strengthening. Lumbar spine radiographs. Lumbar spine radiographs showed no acute osseous abnormality. Interval progression of the lower lumbar predominant degenerative change. R hip radiographs showed no fracture or dislocation of the right hip. Mild right hip degenerative change.    Limitations Walking    Diagnostic tests See history    Patient Stated Goals "I want to hike without pain"                TREATMENT   Therapeutic Exercise NuStep for warmup, L2 for 5 minutes during history (2 minutes unbilled); R SLR with 3s concentric lift and 6s eccentric lowering 2 x 10; L sidelying R hip straight leg abduction 2 x 10; L sidelying R hip clams with manual resistance 2 x 10; L sidelying R hip reverse clams 2 x 10; HEP  modifications   Manual Therapy R hip figure 4 stretch x 45s; Supine R hip flexor stretch off side of table 45s x 3; Right hip long axis distraction with belt assist, grade 3, 30 seconds per bout x 2 bouts; Right hip inferior mobilizations at 90 flexion with belt assist, grade 3, 30 seconds per bout x3 bouts; Right hip inferior mobilizations with hip in FABER position with belt assist, grade 3, 30 seconds per bout x3 bouts; Right hip medial to lateral mobilizations with hip flexed to 45 degrees with belt assist, grade 3, 30 seconds per bout x3 bouts;   Pt educated throughout session about proper posture and technique with exercises. Improved exercise technique, movement at target joints, use of target muscles after min to mod verbal, visual, tactile cues.    Pt arrives with excellent motivation today. Continued with right hip abduction and adduction strengthening as well as hip flexor strengthening. Progressed to concentric strengthening and pt denies any increase in her pain. Modified HEP to include concentric SLR with slow eccentric phase as well as L sidelying right straight leg hip abduction. Continued with belt-assisted right hip mobilizations.  Deferred lumbar mobilizations again on this date. Pt will benefit from skilled PT services to address deficits and return to pain-free function at home and with hiking.           PT Short Term Goals - 09/27/20 1751       PT SHORT TERM GOAL #1   Title Pt will be independent with HEP in order to improve strength and decrease hip pain in order to improve pain-free function at home and with leisure activity    Time 4    Period Weeks    Status New    Target Date 10/25/20               PT Long Term Goals - 09/27/20 1752       PT LONG TERM GOAL #1   Title Pt will decrease worst R hip pain as reported on NPRS by at least 3 points in order to demonstrate clinically significant reduction in hip pain.    Baseline 09/26/20: worst: 8/10     Time 8    Period Weeks    Status New    Target Date 11/21/20      PT LONG TERM GOAL #2   Title Pt will increase pain-free bilateral hip abduction and R hip flexion strength to at least 4+/5 in order to improve pelvic stability during gait to decrease R hip pain;    Baseline 09/26/20: bilateral hip flexion 4/5, R hip flexion 4/5 with pain    Time 8  Period Weeks    Status New    Target Date 11/21/20      PT LONG TERM GOAL #3   Title Pt will increase LEFS by at least 9 points in order to demonstrate significant improvement in lower extremity function.    Baseline 09/26/20: To complete at next visit    Time 8    Period Weeks    Status New    Target Date 11/21/20      PT LONG TERM GOAL #4   Title Pt will increase FOTO to predicted value in order to demonstrate significant improvement in function related to R hip pain    Time 8    Period Weeks    Status New    Target Date 11/21/20                   Plan - 10/12/20 1010     Clinical Impression Statement Pt arrives with excellent motivation today. Continued with right hip abduction and adduction strengthening as well as hip flexor strengthening. Progressed to concentric strengthening and pt denies any increase in her pain. Modified HEP to include concentric SLR with slow eccentric phase as well as L sidelying right straight leg hip abduction. Continued with belt-assisted right hip mobilizations.  Deferred lumbar mobilizations again on this date. Pt will benefit from skilled PT services to address deficits and return to pain-free function at home and with hiking.    Personal Factors and Comorbidities Comorbidity 2    Comorbidities pudental neuralgia, chronic pelvic pain    Examination-Activity Limitations Squat;Stairs    Examination-Participation Restrictions Community Activity;Other   Hiking   Stability/Clinical Decision Making Evolving/Moderate complexity    Rehab Potential Good    PT Frequency 2x / week   once week, every  other, every 3 weeks   PT Duration 8 weeks    PT Treatment/Interventions Aquatic Therapy;Cryotherapy;Electrical Stimulation;Functional mobility training;Stair training;Gait training;Moist Heat;ADLs/Self Care Home Management;Therapeutic activities;Therapeutic exercise;Balance training;Neuromuscular re-education;Patient/family education;Passive range of motion;Scar mobilization;Manual techniques;Dry needling;Energy conservation;Taping;Canalith Repostioning;Iontophoresis '4mg'$ /ml Dexamethasone;Ultrasound;DME Instruction;Cognitive remediation;Vestibular;Spinal Manipulations;Joint Manipulations    PT Next Visit Plan Continue manual techniques and strengthening for right hip pain, review HEP    PT Home Exercise Plan MedBridge: [id number]    Consulted and Agree with Plan of Care Patient               Patient will benefit from skilled therapeutic intervention in order to improve the following deficits and impairments:  Decreased strength, Pain, Decreased range of motion  Visit Diagnosis: Pain in right hip  Muscle weakness (generalized)     Problem List Patient Active Problem List   Diagnosis Date Noted   Other specified conditions associated with female genital organs and menstrual cycle 02/14/2015   GERD (gastroesophageal reflux disease)    Chronic renal failure    Pudendal neuralgia    Pelvic pain syndrome    Lyndel Safe Phyllis Whitefield PT, DPT, GCS  Katheleen Stella 10/12/2020, 1:21 PM  [facility] [address] [place], [place], [address] Phone: [phone]   Fax:  [phone]  Name: Sabrina Baxter MRN: [id number] Date of Birth: 12-Oct-1965

## 2020-10-12 NOTE — Patient Instructions (Addendum)
Access Code: [email or web address] URL: [email or web address] Date: 10/12/2020 Prepared by: Roxana Hires  Exercises Supine Bridge - 1 x daily - 7 x weekly - 2 sets - 10 reps - 3s hold Clamshell (Mirrored) - 1 x daily - 7 x weekly - 2 sets - 10 reps - 3s hold Sidelying Reverse Clamshell (Mirrored) - 1 x daily - 7 x weekly - 2 sets - 10 reps Sidelying Hip Abduction - 1 x daily - 7 x weekly - 2 sets - 10 reps - 3s hold Supine Active Straight Leg Raise - 1 x daily - 7 x weekly - 2 sets - 10 reps - 2-3s concentric phase (lift), 4-6s eccentric phase (lowering) hold Mini Squat with Counter Support - 1 x daily - 7 x weekly - 2 sets - 10 reps Standing Hip Flexor Stretch - 2 x daily - 7 x weekly - 3-5 reps - 45s hold

## 2020-10-17 ENCOUNTER — Ambulatory Visit

## 2020-10-17 ENCOUNTER — Other Ambulatory Visit: Payer: Self-pay

## 2020-10-17 DIAGNOSIS — M25551 Pain in right hip: Secondary | ICD-10-CM | POA: Diagnosis not present

## 2020-10-17 DIAGNOSIS — M6281 Muscle weakness (generalized): Secondary | ICD-10-CM

## 2020-10-17 NOTE — Therapy (Signed)
[facility] [address] [place], [place], [address] Phone: [phone]   Fax:  [phone]  Physical Therapy Treatment  Patient Details  Name: Sabrina Baxter MRN: [id number] Date of Birth: 08-Sep-1965 Referring Provider (PT): Park Liter DO   Encounter Date: 10/17/2020   PT End of Session - 10/17/20 0937     Visit Number 7    Number of Visits 17    Date for PT Re-Evaluation 11/21/20    Authorization Type eval: 09/26/20    PT Start Time 0933    PT Stop Time 1015    PT Time Calculation (min) 42 min    Activity Tolerance Patient tolerated treatment well    Behavior During Therapy [facility] for tasks assessed/performed               Past Medical History:  Diagnosis Date   Abnormal thyroid function test    Actinic keratosis    Atypical mole 10/13/2017   Left mid tricep    Atypical mole 01/01/2010   right sup lat calf    Atypical mole 01/01/2010   left post lat thigh above popliteal    Atypical mole 05/09/2009   right scapula    Atypical mole 01/03/2009   xyphoid    Chronic neck pain    Chronic renal failure    worsened with NSAIDs   Disorder of bursae and tendons in shoulder region    Fibroid    55yo   Gastritis    GERD (gastroesophageal reflux disease)    Hyperreflexia    Pelvic pain syndrome    Pudendal neuralgia    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   Uterine fibroid     Past Surgical History:  Procedure Laterality Date   ABDOMINAL HYSTERECTOMY  2012   hemorrhaging   APPENDECTOMY     COLONOSCOPY WITH PROPOFOL N/A 01/22/2015   Procedure: COLONOSCOPY WITH PROPOFOL;  Surgeon: Lollie Sails, MD;  Location: [facility] ENDOSCOPY;  Service: Endoscopy;  Laterality: N/A;   ESOPHAGOGASTRODUODENOSCOPY (EGD) WITH PROPOFOL N/A 01/22/2015   Procedure: ESOPHAGOGASTRODUODENOSCOPY (EGD) WITH PROPOFOL;  Surgeon: Lollie Sails, MD;  Location: [facility] ENDOSCOPY;  Service: Endoscopy;  Laterality: N/A;   NISSEN FUNDOPLICATION  [id number]    [place]    There were no vitals filed for this visit.   Subjective Assessment - 10/17/20 0935     Subjective Pt states she is doing well today. Denies significant pain at the start of session. She went for a 2.5 mile walk this morning prior to PT with mild irritation of hip pain. She did have a lot of pain over the weekend and states that she worked outside a lot on Friday. She also had a lot of pain working at [employer] with a lot of bending and lifting. No questions or concerns at this time.    Pertinent History Pt reports that last summer she increased her daily walking distance from 3-4 miles. She started having R hip/groin pain afterward. No trauma that she can recall. She reports difficulty with sitting due to her hip pain as well as pain with walking uphill, rocking in her rocker, and hiking. She stretches daily which does help. 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 She used to bike frequently but on December 27, 2012 she started having severe bilateral low back pain which radiated down to her buttocks. She also started having coccyx pain. She went to  pelvic health physical therapy which was helpful but eventually she discovered how to stretch and foam roll to ease the pain. Pain has been improving over the last few weeks with R hip flexor strengthening. Lumbar spine radiographs. Lumbar spine radiographs showed no acute osseous abnormality. Interval progression of the lower lumbar predominant degenerative change. R hip radiographs showed no fracture or dislocation of the right hip. Mild right hip degenerative change.    Limitations Walking    Diagnostic tests See history    Patient Stated Goals "I want to hike without pain"                TREATMENT   Therapeutic Exercise NuStep for warmup, L2 for 5 minutes during history (2 minutes unbilled); R SLR eccentrics with pt performing heel slight and therapist lifting leg with controlled  slow eccentric lowering by patient x 10; Supine R hip abduction/adduction with manual resistance from therapist x 10 each;   Manual Therapy R hip figure 4 stretch 2 x 45s; Supine R hip flexor stretch off side of table 45s x 3; Right hip A/P mobilizations at neutral, grade 3, 30 seconds per bout x 3 bouts; Right hip long axis distraction with belt assist, grade 3, 30 seconds per bout x 2 bouts; Right hip inferior mobilizations at 90 flexion with belt assist, grade 3, 30 seconds per bout x3 bouts; Right hip inferior mobilizations with hip in FABER position with belt assist, grade 3, 30 seconds per bout x3 bouts; Right hip medial to lateral mobilizations with hip flexed to 45 degrees with belt assist, grade 3, 30 seconds per bout x3 bouts;   Electrical Stimulation, unbilled  Modulated TENS applied to R anterior hip/groin in 2 channel cross pattern using default setting on Empi Continuum unit during exercise to decrease pain and assess tolerance/benefit for home use. Pt tolerated intensity of 10 on both channels. No visible skin irritation noted afterwards.     Pt educated throughout session about proper posture and technique with exercises. Improved exercise technique, movement at target joints, use of target muscles after min to mod verbal, visual, tactile cues.    Pt arrives with excellent motivation today. Continued with right hip abduction and adduction strengthening as well as hip flexor strengthening. Pt reports increase in R hip/groin pain at home so she regressed appropriately to eccentrics. Continued with eccentric SLR during session today. Continued with belt-assisted right hip mobilizations as well and utilized TENS unit to anterior hip/groin to assess tolerance as pt is trying to find pain relieving modalities she can use at home. Pt will benefit from skilled PT services to address deficits and return to pain-free function at home and with hiking.           PT Short Term Goals  - 09/27/20 1751       PT SHORT TERM GOAL #1   Title Pt will be independent with HEP in order to improve strength and decrease hip pain in order to improve pain-free function at home and with leisure activity    Time 4    Period Weeks    Status New    Target Date 10/25/20               PT Long Term Goals - 09/27/20 1752       PT LONG TERM GOAL #1   Title Pt will decrease worst R hip pain as reported on NPRS by at least 3 points in order to demonstrate clinically significant reduction in hip  pain.    Baseline 09/26/20: worst: 8/10    Time 8    Period Weeks    Status New    Target Date 11/21/20      PT LONG TERM GOAL #2   Title Pt will increase pain-free bilateral hip abduction and R hip flexion strength to at least 4+/5 in order to improve pelvic stability during gait to decrease R hip pain;    Baseline 09/26/20: bilateral hip flexion 4/5, R hip flexion 4/5 with pain    Time 8    Period Weeks    Status New    Target Date 11/21/20      PT LONG TERM GOAL #3   Title Pt will increase LEFS by at least 9 points in order to demonstrate significant improvement in lower extremity function.    Baseline 09/26/20: To complete at next visit    Time 8    Period Weeks    Status New    Target Date 11/21/20      PT LONG TERM GOAL #4   Title Pt will increase FOTO to predicted value in order to demonstrate significant improvement in function related to R hip pain    Time 8    Period Weeks    Status New    Target Date 11/21/20                   Plan - 10/17/20 1333     Clinical Impression Statement Pt arrives with excellent motivation today. Continued with right hip abduction and adduction strengthening as well as hip flexor strengthening. Pt reports increase in R hip/groin pain at home so she regressed appropriately to eccentrics. Continued with eccentric SLR during session today. Continued with belt-assisted right hip mobilizations as well and utilized TENS unit to anterior  hip/groin to assess tolerance as pt is trying to find pain relieving modalities she can use at home. Pt will benefit from skilled PT services to address deficits and return to pain-free function at home and with hiking.    Personal Factors and Comorbidities Comorbidity 2    Comorbidities pudental neuralgia, chronic pelvic pain    Examination-Activity Limitations Squat;Stairs    Examination-Participation Restrictions Community Activity;Other   Hiking   Stability/Clinical Decision Making Evolving/Moderate complexity    Rehab Potential Good    PT Frequency 2x / week   once week, every other, every 3 weeks   PT Duration 8 weeks    PT Treatment/Interventions Aquatic Therapy;Cryotherapy;Electrical Stimulation;Functional mobility training;Stair training;Gait training;Moist Heat;ADLs/Self Care Home Management;Therapeutic activities;Therapeutic exercise;Balance training;Neuromuscular re-education;Patient/family education;Passive range of motion;Scar mobilization;Manual techniques;Dry needling;Energy conservation;Taping;Canalith Repostioning;Iontophoresis '4mg'$ /ml Dexamethasone;Ultrasound;DME Instruction;Cognitive remediation;Vestibular;Spinal Manipulations;Joint Manipulations    PT Next Visit Plan Continue manual techniques and strengthening for right hip pain, review HEP    PT Home Exercise Plan MedBridge: [id number]    Consulted and Agree with Plan of Care Patient                Patient will benefit from skilled therapeutic intervention in order to improve the following deficits and impairments:  Decreased strength, Pain, Decreased range of motion  Visit Diagnosis: Pain in right hip  Muscle weakness (generalized)     Problem List Patient Active Problem List   Diagnosis Date Noted   Other specified conditions associated with female genital organs and menstrual cycle 02/14/2015   GERD (gastroesophageal reflux disease)    Chronic renal failure    Pudendal neuralgia    Pelvic pain syndrome     Lyndel Safe Orlan Aversa  PT, DPT, GCS  Argusta Mcgann 10/17/2020, 1:44 PM  [facility] [address] [place], [place], [address] Phone: [phone]   Fax:  [phone]  Name: Sabrina Baxter MRN: [id number] Date of Birth: 03/08/1965

## 2020-10-19 ENCOUNTER — Other Ambulatory Visit: Payer: Self-pay

## 2020-10-19 ENCOUNTER — Ambulatory Visit

## 2020-10-19 DIAGNOSIS — M25551 Pain in right hip: Secondary | ICD-10-CM | POA: Diagnosis not present

## 2020-10-19 DIAGNOSIS — M6281 Muscle weakness (generalized): Secondary | ICD-10-CM

## 2020-10-19 NOTE — Therapy (Signed)
[facility] [address] [place], [place], [address] Phone: [phone]   Fax:  [phone]  Physical Therapy Treatment  Patient Details  Name: Sabrina Baxter MRN: [id number] Date of Birth: Oct 04, 1965 Referring Provider (PT): Park Liter DO   Encounter Date: 10/19/2020   PT End of Session - 10/19/20 0932     Visit Number 8    Number of Visits 17    Date for PT Re-Evaluation 11/21/20    Authorization Type eval: 57/3/22    PT Start Time 0930    PT Stop Time 1015    PT Time Calculation (min) 45 min    Activity Tolerance Patient tolerated treatment well    Behavior During Therapy [place] for tasks assessed/performed               Past Medical History:  Diagnosis Date   Abnormal thyroid function test    Actinic keratosis    Atypical mole 10/13/2017   Left mid tricep    Atypical mole 01/01/2010   right sup lat calf    Atypical mole 01/01/2010   left post lat thigh above popliteal    Atypical mole 05/09/2009   right scapula    Atypical mole 01/03/2009   xyphoid    Chronic neck pain    Chronic renal failure    worsened with NSAIDs   Disorder of bursae and tendons in shoulder region    Fibroid    55yo   Gastritis    GERD (gastroesophageal reflux disease)    Hyperreflexia    Pelvic pain syndrome    Pudendal neuralgia    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   Uterine fibroid     Past Surgical History:  Procedure Laterality Date   ABDOMINAL HYSTERECTOMY  2012   hemorrhaging   APPENDECTOMY     COLONOSCOPY WITH PROPOFOL N/A 01/22/2015   Procedure: COLONOSCOPY WITH PROPOFOL;  Surgeon: Lollie Sails, MD;  Location: [facility] ENDOSCOPY;  Service: Endoscopy;  Laterality: N/A;   ESOPHAGOGASTRODUODENOSCOPY (EGD) WITH PROPOFOL N/A 01/22/2015   Procedure: ESOPHAGOGASTRODUODENOSCOPY (EGD) WITH PROPOFOL;  Surgeon: Lollie Sails, MD;  Location: [facility] ENDOSCOPY;  Service: Endoscopy;  Laterality: N/A;   NISSEN FUNDOPLICATION  [id number]    [place]    There were no vitals filed for this visit.   Subjective Assessment - 10/19/20 0931     Subjective Pt states she is doing well today. Denies significant pain at the start of session.  She walked this morning with mild irritation of hip pain.  She also reports some interval left hip pain recently.  She has been compliant with her home exercise program and would like to review her stretching and strengthening home program today to ensure proper compliance.    Pertinent History Pt reports that last summer she increased her daily walking distance from 3-4 miles. She started having R hip/groin pain afterward. No trauma that she can recall. She reports difficulty with sitting due to her hip pain as well as pain with walking uphill, rocking in her rocker, and hiking. She stretches daily which does help. 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 She used to bike frequently but on December 27, 2012 she started having severe bilateral low back pain which radiated down to her buttocks. She also started having coccyx pain. She went to pelvic health physical therapy which was helpful but eventually she discovered how to stretch and foam roll  to ease the pain. Pain has been improving over the last few weeks with R hip flexor strengthening. Lumbar spine radiographs. Lumbar spine radiographs showed no acute osseous abnormality. Interval progression of the lower lumbar predominant degenerative change. R hip radiographs showed no fracture or dislocation of the right hip. Mild right hip degenerative change.    Limitations Walking    Diagnostic tests See history    Patient Stated Goals "I want to hike without pain"                TREATMENT   Therapeutic Exercise NuStep for warmup, L2-3 for 4 minutes during history (2 minutes unbilled); Extensive review with patient of home stretching and strengthening program for duration of session today on floor with pad.   Reviewed and performed all patient stretches including child's pose, standing hamstring stretch, standing hip flexor/runners stretch, standing adductor stretch, supine single and double knee-to-chest, supine piriformis figure 4 and cross body stretch, pigeon pose stretch, frog pose stretch, and half kneeling hip flexor stretch; Reviewed foam rolling techniques with patient for anterior, medial, lateral, and posterior hip; Reviewed strengthening exercises including progression of bridges from double to single leg, side-lying clams, side-lying reverse clams, and side-lying hip abduction HEP updated with patient;   Pt educated throughout session about proper posture and technique with exercises. Improved exercise technique, movement at target joints, use of target muscles after min to mod verbal, visual, tactile cues.    Pt arrives with excellent motivation today.  Extensive review of home stretching and strengthening program during session today.  Patient educated about additional variations in positions of stretches she can perform for her right hip.  Cueing and correction provided for strengthening exercises.  Pt will benefit from skilled PT services to address deficits and return to pain-free function at home and with hiking.           PT Short Term Goals - 09/27/20 1751       PT SHORT TERM GOAL #1   Title Pt will be independent with HEP in order to improve strength and decrease hip pain in order to improve pain-free function at home and with leisure activity    Time 4    Period Weeks    Status New    Target Date 10/25/20               PT Long Term Goals - 09/27/20 1752       PT LONG TERM GOAL #1   Title Pt will decrease worst R hip pain as reported on NPRS by at least 3 points in order to demonstrate clinically significant reduction in hip pain.    Baseline 57/3/22: worst: 8/10    Time 8    Period Weeks    Status New    Target Date 11/21/20      PT LONG TERM GOAL #2    Title Pt will increase pain-free bilateral hip abduction and R hip flexion strength to at least 4+/5 in order to improve pelvic stability during gait to decrease R hip pain;    Baseline 09/26/20: bilateral hip flexion 4/5, R hip flexion 4/5 with pain    Time 8    Period Weeks    Status New    Target Date 11/21/20      PT LONG TERM GOAL #3   Title Pt will increase LEFS by at least 9 points in order to demonstrate significant improvement in lower extremity function.    Baseline 09/26/20: To complete at  next visit    Time 8    Period Weeks    Status New    Target Date 11/21/20      PT LONG TERM GOAL #4   Title Pt will increase FOTO to predicted value in order to demonstrate significant improvement in function related to R hip pain    Time 8    Period Weeks    Status New    Target Date 11/21/20                   Plan - 10/19/20 0932     Clinical Impression Statement Pt arrives with excellent motivation today.  Extensive review of home stretching and strengthening program during session today.  Patient educated about additional variations in positions of stretches she can perform for her right hip.  Cueing and correction provided for strengthening exercises.  Pt will benefit from skilled PT services to address deficits and return to pain-free function at home and with hiking.    Personal Factors and Comorbidities Comorbidity 2    Comorbidities pudental neuralgia, chronic pelvic pain    Examination-Activity Limitations Squat;Stairs    Examination-Participation Restrictions Community Activity;Other   Hiking   Stability/Clinical Decision Making Evolving/Moderate complexity    Rehab Potential Good    PT Frequency 2x / week   once week, every other, every 3 weeks   PT Duration 8 weeks    PT Treatment/Interventions Aquatic Therapy;Cryotherapy;Electrical Stimulation;Functional mobility training;Stair training;Gait training;Moist Heat;ADLs/Self Care Home Management;Therapeutic  activities;Therapeutic exercise;Balance training;Neuromuscular re-education;Patient/family education;Passive range of motion;Scar mobilization;Manual techniques;Dry needling;Energy conservation;Taping;Canalith Repostioning;Iontophoresis '4mg'$ /ml Dexamethasone;Ultrasound;DME Instruction;Cognitive remediation;Vestibular;Spinal Manipulations;Joint Manipulations    PT Next Visit Plan Continue manual techniques and strengthening for right hip pain, review HEP    PT Home Exercise Plan MedBridge: [id number]    Consulted and Agree with Plan of Care Patient                Patient will benefit from skilled therapeutic intervention in order to improve the following deficits and impairments:  Decreased strength, Pain, Decreased range of motion  Visit Diagnosis: Pain in right hip  Muscle weakness (generalized)     Problem List Patient Active Problem List   Diagnosis Date Noted   Other specified conditions associated with female genital organs and menstrual cycle 02/14/2015   GERD (gastroesophageal reflux disease)    Chronic renal failure    Pudendal neuralgia    Pelvic pain syndrome     Lyndel Safe Jaziah Kwasnik PT, DPT, GCS  Yarelin Reichardt 10/19/2020, 1:47 PM   [facility] [address] [place], [place], [address] Phone: [phone]   Fax:  [phone]  Name: Sabrina Baxter MRN: [id number] Date of Birth: [birth year]

## 2020-10-23 ENCOUNTER — Ambulatory Visit

## 2020-10-23 ENCOUNTER — Other Ambulatory Visit: Payer: Self-pay

## 2020-10-23 DIAGNOSIS — R2689 Other abnormalities of gait and mobility: Secondary | ICD-10-CM

## 2020-10-23 DIAGNOSIS — M6281 Muscle weakness (generalized): Secondary | ICD-10-CM

## 2020-10-23 DIAGNOSIS — M25551 Pain in right hip: Secondary | ICD-10-CM | POA: Diagnosis not present

## 2020-10-23 NOTE — Therapy (Signed)
[facility] [address] [place], [place], [address] Phone: [phone]   Fax:  [phone]  Physical Therapy Treatment  Patient Details  Name: Sabrina Baxter MRN: [id number] Date of Birth: 12-24-1965 Referring Provider (PT): Park Liter DO   Encounter Date: 10/23/2020   PT End of Session - 10/23/20 1801     Visit Number 9    Number of Visits 17    Date for PT Re-Evaluation 11/21/20    Authorization Type eval: 09/26/20    PT Start Time 1400    PT Stop Time 1445    PT Time Calculation (min) 45 min    Activity Tolerance Patient tolerated treatment well    Behavior During Therapy [facility] for tasks assessed/performed               Past Medical History:  Diagnosis Date   Abnormal thyroid function test    Actinic keratosis    Atypical mole 10/13/2017   Left mid tricep    Atypical mole 01/01/2010   right sup lat calf    Atypical mole 01/01/2010   left post lat thigh above popliteal    Atypical mole 05/09/2009   right scapula    Atypical mole 01/03/2009   xyphoid    Chronic neck pain    Chronic renal failure    worsened with NSAIDs   Disorder of bursae and tendons in shoulder region    Fibroid    55yo   Gastritis    GERD (gastroesophageal reflux disease)    Hyperreflexia    Pelvic pain syndrome    Pudendal neuralgia    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   Uterine fibroid     Past Surgical History:  Procedure Laterality Date   ABDOMINAL HYSTERECTOMY  2012   hemorrhaging   APPENDECTOMY     COLONOSCOPY WITH PROPOFOL N/A 01/22/2015   Procedure: COLONOSCOPY WITH PROPOFOL;  Surgeon: Lollie Sails, MD;  Location: [facility] ENDOSCOPY;  Service: Endoscopy;  Laterality: N/A;   ESOPHAGOGASTRODUODENOSCOPY (EGD) WITH PROPOFOL N/A 01/22/2015   Procedure: ESOPHAGOGASTRODUODENOSCOPY (EGD) WITH PROPOFOL;  Surgeon: Lollie Sails, MD;  Location: [facility] ENDOSCOPY;  Service: Endoscopy;  Laterality: N/A;   NISSEN FUNDOPLICATION  [id number]    [place]    There were no vitals filed for this visit.   Subjective Assessment - 10/23/20 1411     Subjective Pt states she is doing well today. Denies significant pain at the start of session. She reports significant improvement in her hip pain but then had some recurrence after a bout of extended sitting to work at her desk. She has also been having some R knee pain recently and wonders if it is related to her sitting posture at her desk.    Pertinent History Pt reports that last summer she increased her daily walking distance from 3-4 miles. She started having R hip/groin pain afterward. No trauma that she can recall. She reports difficulty with sitting due to her hip pain as well as pain with walking uphill, rocking in her rocker, and hiking. She stretches daily which does help. 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 She used to bike frequently but on December 27, 2012 she started having severe bilateral low back pain which radiated down to her buttocks. She also started having coccyx pain. She went to pelvic health physical therapy which was helpful but eventually she discovered how to stretch and foam roll  to ease the pain. Pain has been improving over the last few weeks with R hip flexor strengthening. Lumbar spine radiographs. Lumbar spine radiographs showed no acute osseous abnormality. Interval progression of the lower lumbar predominant degenerative change. R hip radiographs showed no fracture or dislocation of the right hip. Mild right hip degenerative change.    Limitations Walking    Diagnostic tests See history    Patient Stated Goals "I want to hike without pain"                TREATMENT   Therapeutic Exercise NuStep for warmup BLE only, L2 for 5 minutes during history (2 minutes unbilled); R SLR eccentrics with pt performing heel slight and therapist lifting leg with controlled slow eccentric lowering by patient x 10; L  sidelying R hip straight leg hip abduction x 10; L sidelying R hip clams with manual resistance x 10; L sidelying R hip reverse clams with manual resistance x 10;   Manual Therapy R hip figure 4 stretch x 45s; Supine R hip flexor stretch off side of table 45s x 2; Right hip A/P mobilizations at neutral, grade 3, 30 seconds per bout x 3 bouts; Right hip long axis distraction with belt assist, grade 3, 30 seconds per bout x 2 bouts; Right hip inferior mobilizations at 90 flexion with belt assist, grade 3, 30 seconds per bout x3 bouts; Right hip inferior mobilizations with hip in FABER position with belt assist, grade 3, 30 seconds per bout x3 bouts; Right hip medial to lateral mobilizations with hip flexed to 45 degrees with belt assist, grade 3, 30 seconds per bout x3 bouts;   Pt educated throughout session about proper posture and technique with exercises. Improved exercise technique, movement at target joints, use of target muscles after min to mod verbal, visual, tactile cues.    Pt arrives with excellent motivation today. Continued with right hip abduction as well as eccentric hip flexor strengthening. Continued with belt-assisted right hip mobilizations as well. Pt encouraged to continue with eccentric hip flexor strengthening and when she is ready attempt concentric strengthening as long as she can perform without pain. She will need updated outcome measures, goals, and a progress note at next visit. Pt will benefit from skilled PT services to address deficits and return to pain-free function at home and with hiking.           PT Short Term Goals - 09/27/20 1751       PT SHORT TERM GOAL #1   Title Pt will be independent with HEP in order to improve strength and decrease hip pain in order to improve pain-free function at home and with leisure activity    Time 4    Period Weeks    Status New    Target Date 10/25/20               PT Long Term Goals - 09/27/20 1752        PT LONG TERM GOAL #1   Title Pt will decrease worst R hip pain as reported on NPRS by at least 3 points in order to demonstrate clinically significant reduction in hip pain.    Baseline 09/26/20: worst: 8/10    Time 8    Period Weeks    Status New    Target Date 11/21/20      PT LONG TERM GOAL #2   Title Pt will increase pain-free bilateral hip abduction and R hip flexion strength to at least 4+/5  in order to improve pelvic stability during gait to decrease R hip pain;    Baseline 09/26/20: bilateral hip flexion 4/5, R hip flexion 4/5 with pain    Time 8    Period Weeks    Status New    Target Date 11/21/20      PT LONG TERM GOAL #3   Title Pt will increase LEFS by at least 9 points in order to demonstrate significant improvement in lower extremity function.    Baseline 09/26/20: To complete at next visit    Time 8    Period Weeks    Status New    Target Date 11/21/20      PT LONG TERM GOAL #4   Title Pt will increase FOTO to predicted value in order to demonstrate significant improvement in function related to R hip pain    Time 8    Period Weeks    Status New    Target Date 11/21/20                   Plan - 10/23/20 1802     Clinical Impression Statement Pt arrives with excellent motivation today. Continued with right hip abduction as well as eccentric hip flexor strengthening. Continued with belt-assisted right hip mobilizations as well. Pt encouraged to continue with eccentric hip flexor strengthening and when she is ready attempt concentric strengthening as long as she can perform without pain. She will need updated outcome measures, goals, and a progress note at next visit. Pt will benefit from skilled PT services to address deficits and return to pain-free function at home and with hiking.    Personal Factors and Comorbidities Comorbidity 2    Comorbidities pudental neuralgia, chronic pelvic pain    Examination-Activity Limitations Squat;Stairs     Examination-Participation Restrictions Community Activity;Other   Hiking   Stability/Clinical Decision Making Evolving/Moderate complexity    Rehab Potential Good    PT Frequency 2x / week   once week, every other, every 3 weeks   PT Duration 8 weeks    PT Treatment/Interventions Aquatic Therapy;Cryotherapy;Electrical Stimulation;Functional mobility training;Stair training;Gait training;Moist Heat;ADLs/Self Care Home Management;Therapeutic activities;Therapeutic exercise;Balance training;Neuromuscular re-education;Patient/family education;Passive range of motion;Scar mobilization;Manual techniques;Dry needling;Energy conservation;Taping;Canalith Repostioning;Iontophoresis '4mg'$ /ml Dexamethasone;Ultrasound;DME Instruction;Cognitive remediation;Vestibular;Spinal Manipulations;Joint Manipulations    PT Next Visit Plan Updated outcome measures, goals, and progress note; Continue manual techniques and strengthening for right hip pain, review HEP    PT Home Exercise Plan MedBridge: [id number]    Consulted and Agree with Plan of Care Patient                Patient will benefit from skilled therapeutic intervention in order to improve the following deficits and impairments:  Decreased strength, Pain, Decreased range of motion  Visit Diagnosis: Pain in right hip  Muscle weakness (generalized)  Other abnormalities of gait and mobility     Problem List Patient Active Problem List   Diagnosis Date Noted   Other specified conditions associated with female genital organs and menstrual cycle 02/14/2015   GERD (gastroesophageal reflux disease)    Chronic renal failure    Pudendal neuralgia    Pelvic pain syndrome    Lyndel Safe Reg Bircher PT, DPT, GCS  Mikyle Sox 10/23/2020, 6:19 PM  [facility] [address] [place], [place], [address] Phone: [phone]   Fax:  [phone]  Name: Sabrina Baxter MRN: [id number] Date of Birth: [birth year]

## 2020-10-24 ENCOUNTER — Encounter

## 2020-10-26 ENCOUNTER — Other Ambulatory Visit: Payer: Self-pay

## 2020-10-26 ENCOUNTER — Ambulatory Visit: Attending: Family Medicine

## 2020-10-26 DIAGNOSIS — M6281 Muscle weakness (generalized): Secondary | ICD-10-CM | POA: Diagnosis present

## 2020-10-26 DIAGNOSIS — M25551 Pain in right hip: Secondary | ICD-10-CM | POA: Diagnosis not present

## 2020-10-26 NOTE — Therapy (Signed)
[facility] [address] [place], [place], [address] Phone: [phone]   Fax:  [phone]  Physical Therapy Progress Note/Discharge  Dates of reporting period  09/26/20   to   10/26/20  Patient Details  Name: Sabrina Baxter MRN: [id number] Date of Birth: [birth year] Referring Provider (PT): Park Liter DO   Encounter Date: 10/26/2020   PT End of Session - 10/26/20 0933     Visit Number 10    Number of Visits 17    Date for PT Re-Evaluation 11/21/20    Authorization Type eval: 09/26/20    PT Start Time 0930    PT Stop Time 1015    PT Time Calculation (min) 45 min    Activity Tolerance Patient tolerated treatment well    Behavior During Therapy [facility] for tasks assessed/performed               Past Medical History:  Diagnosis Date   Abnormal thyroid function test    Actinic keratosis    Atypical mole 10/13/2017   Left mid tricep    Atypical mole 01/01/2010   right sup lat calf    Atypical mole 01/01/2010   left post lat thigh above popliteal    Atypical mole 05/09/2009   right scapula    Atypical mole 01/03/2009   xyphoid    Chronic neck pain    Chronic renal failure    worsened with NSAIDs   Disorder of bursae and tendons in shoulder region    Fibroid    55yo   Gastritis    GERD (gastroesophageal reflux disease)    Hyperreflexia    Pelvic pain syndrome    Pudendal neuralgia    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   Uterine fibroid     Past Surgical History:  Procedure Laterality Date   ABDOMINAL HYSTERECTOMY  2012   hemorrhaging   APPENDECTOMY     COLONOSCOPY WITH PROPOFOL N/A 01/22/2015   Procedure: COLONOSCOPY WITH PROPOFOL;  Surgeon: Lollie Sails, MD;  Location: [facility] ENDOSCOPY;  Service: Endoscopy;  Laterality: N/A;   ESOPHAGOGASTRODUODENOSCOPY (EGD) WITH PROPOFOL N/A 01/22/2015   Procedure: ESOPHAGOGASTRODUODENOSCOPY (EGD) WITH PROPOFOL;  Surgeon: Lollie Sails, MD;  Location: [facility] ENDOSCOPY;   Service: Endoscopy;  Laterality: N/A;   NISSEN FUNDOPLICATION  2831   [place]    There were no vitals filed for this visit.   Subjective Assessment - 10/26/20 0932     Subjective Pt states she is doing well today. Denies significant pain at the start of session. Hip has not been nearly as bothersome as it was prior to starting therapy. She is very pleased with the progress she has made.    Pertinent History Pt reports that last summer she increased her daily walking distance from 3-4 miles. She started having R hip/groin pain afterward. No trauma that she can recall. She reports difficulty with sitting due to her hip pain as well as pain with walking uphill, rocking in her rocker, and hiking. She stretches daily which does help. 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 She used to bike frequently but on December 27, 2012 she started having severe bilateral low back pain which radiated down to her buttocks. She also started having coccyx pain. She went to pelvic health physical therapy which was helpful but eventually she discovered how to stretch and foam roll to ease the pain. Pain has been improving over  the last few weeks with R hip flexor strengthening. Lumbar spine radiographs. Lumbar spine radiographs showed no acute osseous abnormality. Interval progression of the lower lumbar predominant degenerative change. R hip radiographs showed no fracture or dislocation of the right hip. Mild right hip degenerative change.    Limitations Walking    Diagnostic tests See history    Patient Stated Goals "I want to hike without pain"                TREATMENT   Manual Therapy  NuStep for warmup BLE only, L2 for 5 minutes during history (unbilled); Updated outcome measures with patient; LEFS: 69/80 FOTO: 71 Worst R hip pain: 3/10;  R hip figure 4 stretch x 45s; Supine R hip flexor stretch off side of table 45s x 2; Right hip long axis distraction  with belt assist, grade 3, 30 seconds per bout x 2 bouts; Right hip inferior mobilizations at 90 flexion with belt assist, grade 3, 30 seconds per bout x3 bouts; Right hip inferior mobilizations with hip in FABER position with belt assist, grade 3, 30 seconds per bout x3 bouts; Right hip medial to lateral mobilizations with hip flexed to 45 degrees with belt assist, grade 3, 30 seconds per bout x3 bouts; Reviewed lumbar mobilization techniques with patient using skeleton model and coworker to be able to demonstrate proper techniques as pt is wanting to have husband perform low mobilizations on her lower spine; Reviewed HEP progressions/regressions.    Pt educated throughout session about proper posture and technique with exercises. Improved exercise technique, movement at target joints, use of target muscles after min to mod verbal, visual, tactile cues.    Pt arrives with excellent motivation today. Updated outcome measures with patient. FOTO increased from 56 at initial evaluation to 37 today. Her LEFS is 69/80 indicated high lower extremity function. Her worst hip pain has decreased from 8/10 at initial evaluation to 3/10 today. Pt reports that she is ready for discharge. Continued with belt-assisted right hip mobilizations as well. Reviewed lumbar mobilization techniques with patient using skeleton model and coworker to be able to demonstrate proper techniques as pt is wanting to have husband perform low mobilizations on her lower spine. Reviewed HEP progressions/regressions that she can utilize at home. Pt will be discharged on this date having achieved adequate response to therapy.           PT Short Term Goals - 10/26/20 1251       PT SHORT TERM GOAL #1   Title Pt will be independent with HEP in order to improve strength and decrease hip pain in order to improve pain-free function at home and with leisure activity    Time 4    Period Weeks    Status Achieved                PT Long Term Goals - 10/26/20 7703       PT LONG TERM GOAL #1   Title Pt will decrease worst R hip pain as reported on NPRS by at least 3 points in order to demonstrate clinically significant reduction in hip pain.    Baseline 09/26/20: worst: 8/10; 10/26/20: 3/10    Time 8    Period Weeks    Status Achieved      PT LONG TERM GOAL #2   Title Pt will increase pain-free bilateral hip abduction and R hip flexion strength to at least 4+/5 in order to improve pelvic stability during gait to decrease R  hip pain;    Baseline 09/26/20: bilateral hip flexion 4/5, R hip flexion 4/5 with pain    Time 8    Period Weeks    Status Deferred      PT LONG TERM GOAL #3   Title Pt will increase LEFS by at least 9 points in order to demonstrate significant improvement in lower extremity function.    Baseline 09/26/20: To complete at next visit; 10/26/20: 69/80, high functioning    Time 8    Period Weeks    Status Unable to assess      PT LONG TERM GOAL #4   Title Pt will increase FOTO to predicted value of 72 in order to demonstrate significant improvement in function related to R hip pain    Baseline 09/26/20: 56, 10/26/20: 71    Time 8    Period Weeks    Status Partially Met                   Plan - 10/26/20 1251     Personal Factors and Comorbidities Comorbidity 2    Comorbidities pudental neuralgia, chronic pelvic pain    Examination-Activity Limitations Squat;Stairs    Examination-Participation Restrictions Community Activity;Other   Hiking   Stability/Clinical Decision Making Evolving/Moderate complexity    Rehab Potential Good    PT Frequency 2x / week   once week, every other, every 3 weeks   PT Duration 8 weeks    PT Treatment/Interventions Aquatic Therapy;Cryotherapy;Electrical Stimulation;Functional mobility training;Stair training;Gait training;Moist Heat;ADLs/Self Care Home Management;Therapeutic activities;Therapeutic exercise;Balance training;Neuromuscular re-education;Patient/family  education;Passive range of motion;Scar mobilization;Manual techniques;Dry needling;Energy conservation;Taping;Canalith Repostioning;Iontophoresis 62m/ml Dexamethasone;Ultrasound;DME Instruction;Cognitive remediation;Vestibular;Spinal Manipulations;Joint Manipulations    PT Next Visit Plan Discharge    PT Home Exercise Plan MedBridge: [email or web address]   Consulted and Agree with Plan of Care Patient                 Patient will benefit from skilled therapeutic intervention in order to improve the following deficits and impairments:  Decreased strength, Pain, Decreased range of motion  Visit Diagnosis: Pain in right hip  Muscle weakness (generalized)     Problem List Patient Active Problem List   Diagnosis Date Noted   Other specified conditions associated with female genital organs and menstrual cycle 02/14/2015   GERD (gastroesophageal reflux disease)    Chronic renal failure    Pudendal neuralgia    Pelvic pain syndrome    JLyndel SafeHuprich PT, DPT, GCS  Saivion Goettel 10/26/2020, 1:01 PM  [facility] [place] [address]Phone: [phone]  Fax:  [phone] Name: CAntonya LeederMRN: [id number] of Birth: [birth year]

## 2020-11-29 LAB — HM MAMMOGRAPHY

## 2021-01-02 ENCOUNTER — Ambulatory Visit (INDEPENDENT_AMBULATORY_CARE_PROVIDER_SITE_OTHER): Payer: Self-pay

## 2021-01-02 ENCOUNTER — Other Ambulatory Visit: Payer: Self-pay

## 2021-01-02 DIAGNOSIS — Z23 Encounter for immunization: Secondary | ICD-10-CM

## 2021-01-23 ENCOUNTER — Encounter: Payer: Self-pay | Admitting: Family Medicine

## 2021-01-23 DIAGNOSIS — K209 Esophagitis, unspecified without bleeding: Secondary | ICD-10-CM

## 2021-02-08 ENCOUNTER — Ambulatory Visit (INDEPENDENT_AMBULATORY_CARE_PROVIDER_SITE_OTHER): Admitting: Family Medicine

## 2021-02-08 ENCOUNTER — Encounter: Payer: Self-pay | Admitting: Family Medicine

## 2021-02-08 ENCOUNTER — Other Ambulatory Visit: Payer: Self-pay

## 2021-02-08 VITALS — BP 128/81 | HR 71 | Temp 98.0°F | Ht 63.5 in | Wt 144.2 lb

## 2021-02-08 DIAGNOSIS — Z0000 Encounter for general adult medical examination without abnormal findings: Secondary | ICD-10-CM

## 2021-02-08 DIAGNOSIS — K2100 Gastro-esophageal reflux disease with esophagitis, without bleeding: Secondary | ICD-10-CM | POA: Insufficient documentation

## 2021-02-08 DIAGNOSIS — Z23 Encounter for immunization: Secondary | ICD-10-CM | POA: Diagnosis not present

## 2021-02-08 MED ORDER — PANTOPRAZOLE SODIUM 20 MG PO TBEC: 20.0000 mg | DELAYED_RELEASE_TABLET | Freq: Two times a day (BID) | ORAL | 1 refills | Status: DC

## 2021-02-08 MED ORDER — CROMOLYN SODIUM 4 % OP SOLN: 2.0000 [drp] | Freq: Four times a day (QID) | OPHTHALMIC | 12 refills | Status: DC

## 2021-02-08 NOTE — Assessment & Plan Note (Signed)
Has appointment with GI scheduled. Will restart protonix. Call with any concerns. Continue to monitor.

## 2021-02-08 NOTE — Progress Notes (Deleted)
° °  BP 128/81    Pulse 71    Temp 98 F (36.7 C) (Oral)    Ht 5' 3.5" (1.613 m)    Wt 144 lb 3.2 oz (65.4 kg)    LMP 07/26/2010 (Approximate)    SpO2 99%    BMI 25.14 kg/m    Subjective:    Patient ID: Sabrina Baxter, female    DOB: [birth year], 55 y.o.   MRN: [id number]  HPI: [name] is a 55 y.o. female  Chief Complaint  Patient presents with   Gastroesophageal Reflux    Relevant past medical, surgical, family and social history reviewed and updated as indicated. Interim medical history since our last visit reviewed. Allergies and medications reviewed and updated.  Review of Systems  Per HPI unless specifically indicated above     Objective:    BP 128/81    Pulse 71    Temp 98 F (36.7 C) (Oral)    Ht 5' 3.5" (1.613 m)    Wt 144 lb 3.2 oz (65.4 kg)    LMP 07/26/2010 (Approximate)    SpO2 99%    BMI 25.14 kg/m   Wt Readings from Last 3 Encounters:  02/08/21 144 lb 3.2 oz (65.4 kg)  08/28/20 145 lb (65.8 kg)  02/15/20 143 lb (64.9 kg)    Physical Exam  Results for orders placed or performed in visit on 12/05/20  HM MAMMOGRAPHY  Result Value Ref Range   HM Mammogram 0-4 Bi-Rad 0-4 Bi-Rad, Self Reported Normal      Assessment & Plan:   Problem List Items Addressed This Visit   None    Follow up plan: No follow-ups on file.

## 2021-02-08 NOTE — Progress Notes (Signed)
BP 128/81    Pulse 71    Temp 98 F (36.7 C) (Oral)    Ht 5' 3.5" (1.613 m)    Wt 144 lb 3.2 oz (65.4 kg)    LMP 07/26/2010 (Approximate)    SpO2 99%    BMI 25.14 kg/m    Subjective:    Patient ID: Sabrina Baxter, female    DOB: [birth year], 55 y.o.   MRN: [id number]  HPI: Sabrina Baxter is a 55 y.o. female presenting on 02/08/2021 for comprehensive medical examination. Current medical complaints include:  GERD GERD control status: exacerbated Satisfied with current treatment? yes Medication side effects: no  Medication compliance: fluctuating Previous GERD medications: pantoprozole Dysphagia: yes Odynophagia:  no Hematemesis: no Blood in stool: no EGD: no  She currently lives with: husband Menopausal Symptoms: yes  Depression Screen done today and results listed below:  Depression screen [facility] 2/9 02/08/2021 01/06/2020 06/18/2017 12/24/2016  Decreased Interest 0 0 0 0  Down, Depressed, Hopeless 0 0 0 0  PHQ - 2 Score 0 0 0 0  Altered sleeping 1 - - 2  Tired, decreased energy 0 - - 1  Change in appetite 0 - - 0  Feeling bad or failure about yourself  0 - - 0  Trouble concentrating 0 - - 0  Moving slowly or fidgety/restless 0 - - 0  Suicidal thoughts 0 - - 0  PHQ-9 Score 1 - - 3  Difficult doing work/chores Not difficult at all - - -    Past Medical History:  Past Medical History:  Diagnosis Date   Abnormal thyroid function test    Actinic keratosis    Atypical mole 10/13/2017   Left mid tricep    Atypical mole 01/01/2010   right sup lat calf    Atypical mole 01/01/2010   left post lat thigh above popliteal    Atypical mole 05/09/2009   right scapula    Atypical mole 01/03/2009   xyphoid    Chronic neck pain    Chronic renal failure    worsened with NSAIDs   Disorder of bursae and tendons in shoulder region    Fibroid    55yo   Gastritis    GERD (gastroesophageal reflux disease)    Hyperreflexia    Pelvic pain syndrome    Pudendal neuralgia    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   Uterine fibroid     Surgical History:  Past Surgical History:  Procedure Laterality Date   APPENDECTOMY     COLONOSCOPY WITH PROPOFOL N/A 01/22/2015   Procedure: COLONOSCOPY WITH PROPOFOL;  Surgeon: Lollie Sails, MD;  Location: [facility] ENDOSCOPY;  Service: Endoscopy;  Laterality: N/A;   ESOPHAGOGASTRODUODENOSCOPY (EGD) WITH PROPOFOL N/A 01/22/2015   Procedure: ESOPHAGOGASTRODUODENOSCOPY (EGD) WITH PROPOFOL;  Surgeon: Lollie Sails, MD;  Location: [facility] ENDOSCOPY;  Service: Endoscopy;  Laterality: N/A;   NISSEN FUNDOPLICATION  24/10/7351   TOTAL ABDOMINAL HYSTERECTOMY  02/24/2010   hemorrhaging   UTERINE FIBROID SURGERY  02/24/1981    Medications:  Current Outpatient Medications on File Prior to Visit  Medication Sig   Cholecalciferol (VITAMIN D3) 2000 UNITS capsule Take 2,000 Units by mouth daily.   Magnesium 250 MG TABS Take 250 mg by mouth daily.   Multiple Vitamin (MULTI-DAY) TABS Take 1 tablet by mouth daily.   No current facility-administered medications on file prior to visit.    Allergies:  Allergies  Allergen Reactions   Amoxicillin    Diclofenac  Mobic [Meloxicam]     Social History:  Social History   Socioeconomic History   Marital status: Married    Spouse name: Not on file   Number of children: Not on file   Years of education: Not on file   Highest education level: Not on file  Occupational History   Not on file  Tobacco Use   Smoking status: Never   Smokeless tobacco: Never  Vaping Use   Vaping Use: Never used  Substance and Sexual Activity   Alcohol use: No   Drug use: No   Sexual activity: Yes    Birth control/protection: Surgical  Other Topics Concern   Not on file  Social History Narrative   Not on file   Social Determinants of Health   Financial Resource Strain: Not on file  Food Insecurity: Not on file  Transportation Needs: Not on file  Physical Activity: Not on file  Stress: Not on file  Social  Connections: Not on file  Intimate Partner Violence: Not on file   Social History   Tobacco Use  Smoking Status Never  Smokeless Tobacco Never   Social History   Substance and Sexual Activity  Alcohol Use No    Family History:  Family History  Problem Relation Age of Onset   Arthritis Mother        rheumatoid   Depression Mother    Lymphoma Mother    Hypertension Sister    Thyroid disease Brother    Bladder Cancer Brother    Breast cancer Neg Hx     Past medical history, surgical history, medications, allergies, family history and social history reviewed with patient today and changes made to appropriate areas of the chart.   Review of Systems  Constitutional: Negative.   HENT: Negative.    Eyes: Negative.   Respiratory: Negative.    Cardiovascular:  Positive for palpitations. Negative for chest pain, orthopnea, claudication, leg swelling and PND.  Gastrointestinal:  Positive for heartburn. Negative for abdominal pain, blood in stool, constipation, diarrhea, melena, nausea and vomiting.  Genitourinary: Negative.   Musculoskeletal: Negative.   Skin: Negative.   Neurological: Negative.   Endo/Heme/Allergies: Negative.   Psychiatric/Behavioral: Negative.    All other ROS negative except what is listed above and in the HPI.      Objective:    BP 128/81    Pulse 71    Temp 98 F (36.7 C) (Oral)    Ht 5' 3.5" (1.613 m)    Wt 144 lb 3.2 oz (65.4 kg)    LMP 07/26/2010 (Approximate)    SpO2 99%    BMI 25.14 kg/m   Wt Readings from Last 3 Encounters:  02/08/21 144 lb 3.2 oz (65.4 kg)  08/28/20 145 lb (65.8 kg)  02/15/20 143 lb (64.9 kg)    Physical Exam Vitals and nursing note reviewed.  Constitutional:      General: She is not in acute distress.    Appearance: Normal appearance. She is not ill-appearing, toxic-appearing or diaphoretic.  HENT:     Head: Normocephalic and atraumatic.     Right Ear: Tympanic membrane, ear canal and external ear normal. There is no  impacted cerumen.     Left Ear: Tympanic membrane, ear canal and external ear normal. There is no impacted cerumen.     Nose: Nose normal. No congestion or rhinorrhea.     Mouth/Throat:     Mouth: Mucous membranes are moist.     Pharynx: Oropharynx is clear. No  oropharyngeal exudate or posterior oropharyngeal erythema.  Eyes:     General: No scleral icterus.       Right eye: No discharge.        Left eye: No discharge.     Extraocular Movements: Extraocular movements intact.     Conjunctiva/sclera: Conjunctivae normal.     Pupils: Pupils are equal, round, and reactive to light.  Neck:     Vascular: No carotid bruit.  Cardiovascular:     Rate and Rhythm: Normal rate and regular rhythm.     Pulses: Normal pulses.     Heart sounds: No murmur heard.   No friction rub. No gallop.  Pulmonary:     Effort: Pulmonary effort is normal. No respiratory distress.     Breath sounds: Normal breath sounds. No stridor. No wheezing, rhonchi or rales.  Chest:     Chest wall: No tenderness.  Abdominal:     General: Abdomen is flat. Bowel sounds are normal. There is no distension.     Palpations: Abdomen is soft. There is no mass.     Tenderness: There is no abdominal tenderness. There is no right CVA tenderness, left CVA tenderness, guarding or rebound.     Hernia: No hernia is present.  Genitourinary:    Comments: Breast and pelvic exams deferred with shared decision making Musculoskeletal:        General: No swelling, tenderness, deformity or signs of injury.     Cervical back: Normal range of motion and neck supple. No rigidity. No muscular tenderness.     Right lower leg: No edema.     Left lower leg: No edema.  Lymphadenopathy:     Cervical: No cervical adenopathy.  Skin:    General: Skin is warm and dry.     Capillary Refill: Capillary refill takes less than 2 seconds.     Coloration: Skin is not jaundiced or pale.     Findings: No bruising, erythema, lesion or rash.  Neurological:      General: No focal deficit present.     Mental Status: She is alert and oriented to person, place, and time. Mental status is at baseline.     Cranial Nerves: No cranial nerve deficit.     Sensory: No sensory deficit.     Motor: No weakness.     Coordination: Coordination normal.     Gait: Gait normal.     Deep Tendon Reflexes: Reflexes normal.  Psychiatric:        Mood and Affect: Mood normal.        Behavior: Behavior normal.        Thought Content: Thought content normal.        Judgment: Judgment normal.    Results for orders placed or performed in visit on 12/05/20  HM MAMMOGRAPHY  Result Value Ref Range   HM Mammogram 0-4 Bi-Rad 0-4 Bi-Rad, Self Reported Normal      Assessment & Plan:   Problem List Items Addressed This Visit       Digestive   Gastroesophageal reflux disease with esophagitis    Has appointment with GI scheduled. Will restart protonix. Call with any concerns. Continue to monitor.       Other Visit Diagnoses     Routine general medical examination at a health care facility    -  Primary   Vaccines up to date. Screening labs checked today. Pap and mammo and colonoscopy up to date. Continue diet and exercise. Call with any concerns.  Relevant Orders   CBC with Differential/Platelet   Comprehensive metabolic panel   Lipid Panel w/o Chol/HDL Ratio   Urinalysis, Routine w reflex microscopic   TSH        Follow up plan: Return in about 1 year (around 02/08/2022).   LABORATORY TESTING:  - Pap smear: not applicable  IMMUNIZATIONS:   - Tdap: Tetanus vaccination status reviewed: Td vaccination indicated and given today. - Influenza: Up to date - Pneumovax: Up to date - Prevnar: Not applicable - COVID: Up to date - HPV: Not applicable - Shingrix vaccine: Administered today  SCREENING: -Mammogram: Up to date  - Colonoscopy: Up to date   PATIENT COUNSELING:   Advised to take 1 mg of folate supplement per day if capable of pregnancy.    Sexuality: Discussed sexually transmitted diseases, partner selection, use of condoms, avoidance of unintended pregnancy  and contraceptive alternatives.   Advised to avoid cigarette smoking.  I discussed with the patient that most people either abstain from alcohol or drink within safe limits (<=14/week and <=4 drinks/occasion for males, <=7/weeks and <= 3 drinks/occasion for females) and that the risk for alcohol disorders and other health effects rises proportionally with the number of drinks per week and how often a drinker exceeds daily limits.  Discussed cessation/primary prevention of drug use and availability of treatment for abuse.   Diet: Encouraged to adjust caloric intake to maintain  or achieve ideal body weight, to reduce intake of dietary saturated fat and total fat, to limit sodium intake by avoiding high sodium foods and not adding table salt, and to maintain adequate dietary potassium and calcium preferably from fresh fruits, vegetables, and low-fat dairy products.    stressed the importance of regular exercise  Injury prevention: Discussed safety belts, safety helmets, smoke detector, smoking near bedding or upholstery.   Dental health: Discussed importance of regular tooth brushing, flossing, and dental visits.    NEXT PREVENTATIVE PHYSICAL DUE IN 1 YEAR. Return in about 1 year (around 02/08/2022).

## 2021-02-09 LAB — COMPREHENSIVE METABOLIC PANEL
ALT: 18 IU/L (ref 0–32)
AST: 25 IU/L (ref 0–40)
Albumin/Globulin Ratio: 2.4 — ABNORMAL HIGH (ref 1.2–2.2)
Albumin: 5 g/dL — ABNORMAL HIGH (ref 3.8–4.9)
Alkaline Phosphatase: 83 IU/L (ref 44–121)
BUN/Creatinine Ratio: 16 (ref 9–23)
BUN: 20 mg/dL (ref 6–24)
Bilirubin Total: 0.4 mg/dL (ref 0.0–1.2)
CO2: 26 mmol/L (ref 20–29)
Calcium: 9.8 mg/dL (ref 8.7–10.2)
Chloride: 100 mmol/L (ref 96–106)
Creatinine, Ser: 1.25 mg/dL — ABNORMAL HIGH (ref 0.57–1.00)
Globulin, Total: 2.1 g/dL (ref 1.5–4.5)
Glucose: 82 mg/dL (ref 70–99)
Potassium: 4.3 mmol/L (ref 3.5–5.2)
Sodium: 142 mmol/L (ref 134–144)
Total Protein: 7.1 g/dL (ref 6.0–8.5)
eGFR: 50 mL/min/{1.73_m2} — ABNORMAL LOW (ref >59)

## 2021-02-09 LAB — LIPID PANEL W/O CHOL/HDL RATIO
Cholesterol, Total: 275 mg/dL — ABNORMAL HIGH (ref 100–199)
HDL: 80 mg/dL (ref >39)
LDL Chol Calc (NIH): 177 mg/dL — ABNORMAL HIGH (ref 0–99)
Triglycerides: 103 mg/dL (ref 0–149)
VLDL Cholesterol Cal: 18 mg/dL (ref 5–40)

## 2021-02-09 LAB — CBC WITH DIFFERENTIAL/PLATELET
Basophils Absolute: 0.1 10*3/uL (ref 0.0–0.2)
Basos: 1 %
EOS (ABSOLUTE): 0 10*3/uL (ref 0.0–0.4)
Eos: 1 %
Hematocrit: 44.2 % (ref 34.0–46.6)
Hemoglobin: 14.7 g/dL (ref 11.1–15.9)
Immature Grans (Abs): 0 10*3/uL (ref 0.0–0.1)
Immature Granulocytes: 0 %
Lymphocytes Absolute: 4.1 10*3/uL — ABNORMAL HIGH (ref 0.7–3.1)
Lymphs: 48 %
MCH: 30.4 pg (ref 26.6–33.0)
MCHC: 33.3 g/dL (ref 31.5–35.7)
MCV: 91 fL (ref 79–97)
Monocytes Absolute: 0.4 10*3/uL (ref 0.1–0.9)
Monocytes: 5 %
Neutrophils Absolute: 3.8 10*3/uL (ref 1.4–7.0)
Neutrophils: 45 %
Platelets: 255 10*3/uL (ref 150–450)
RBC: 4.84 x10E6/uL (ref 3.77–5.28)
RDW: 12.3 % (ref 11.7–15.4)
WBC: 8.5 10*3/uL (ref 3.4–10.8)

## 2021-02-09 LAB — URINALYSIS, ROUTINE W REFLEX MICROSCOPIC
Bilirubin, UA: NEGATIVE
Glucose, UA: NEGATIVE
Ketones, UA: NEGATIVE
Leukocytes,UA: NEGATIVE
Nitrite, UA: NEGATIVE
Protein,UA: NEGATIVE
RBC, UA: NEGATIVE
Specific Gravity, UA: 1.007 (ref 1.005–1.030)
Urobilinogen, Ur: 0.2 mg/dL (ref 0.2–1.0)
pH, UA: 7.5 (ref 5.0–7.5)

## 2021-02-09 LAB — TSH: TSH: 3.85 u[IU]/mL (ref 0.450–4.500)

## 2021-05-07 ENCOUNTER — Ambulatory Visit (INDEPENDENT_AMBULATORY_CARE_PROVIDER_SITE_OTHER)

## 2021-05-07 ENCOUNTER — Other Ambulatory Visit: Payer: Self-pay

## 2021-05-07 DIAGNOSIS — Z23 Encounter for immunization: Secondary | ICD-10-CM

## 2021-09-03 ENCOUNTER — Ambulatory Visit: Admitting: Nurse Practitioner

## 2021-09-03 ENCOUNTER — Encounter: Payer: Self-pay | Admitting: Nurse Practitioner

## 2021-09-03 VITALS — BP 124/84 | HR 84 | Temp 98.2°F | Wt 147.6 lb

## 2021-09-03 DIAGNOSIS — M79641 Pain in right hand: Secondary | ICD-10-CM

## 2021-09-03 NOTE — Progress Notes (Signed)
BP 124/84   Pulse 84   Temp 98.2 F (36.8 C)   Wt 147 lb 9.6 oz (67 kg)   LMP 07/26/2010 (Approximate)   SpO2 98%   BMI 25.74 kg/m    Subjective:    Patient ID: Sabrina Baxter, female    DOB: Aug 25, 1965, 56 y.o.   MRN: [id number]  HPI: Sabrina Baxter is a 57 y.o. female  Chief Complaint  Patient presents with   Wrist Pain    Patient states she tripped and fell yesterday and hurt her right wrist. Patient states wrist is swollen and painful, is not able to use hand.    Patient states she tripped and fell yesterday and hurt her right wrist. Patient states wrist is swollen and painful, is not able to use hand.  Some bruising.  She took some Hydrocodone which didn't even help her pain. Patient put ice on the area.  Patient rates her pain at a 9/10.   Not sure if she fell on her outstretched hand.  She thinks she hit a table.     Relevant past medical, surgical, family and social history reviewed and updated as indicated. Interim medical history since our last visit reviewed. Allergies and medications reviewed and updated.  Review of Systems  Musculoskeletal:        Right hand and wrist pain    Per HPI unless specifically indicated above     Objective:    BP 124/84   Pulse 84   Temp 98.2 F (36.8 C)   Wt 147 lb 9.6 oz (67 kg)   LMP 07/26/2010 (Approximate)   SpO2 98%   BMI 25.74 kg/m   Wt Readings from Last 3 Encounters:  09/03/21 147 lb 9.6 oz (67 kg)  02/08/21 144 lb 3.2 oz (65.4 kg)  08/28/20 145 lb (65.8 kg)    Physical Exam Vitals and nursing note reviewed.  Constitutional:      General: She is not in acute distress.    Appearance: Normal appearance. She is normal weight. She is not ill-appearing, toxic-appearing or diaphoretic.  HENT:     Head: Normocephalic.     Right Ear: External ear normal.     Left Ear: External ear normal.     Nose: Nose normal.     Mouth/Throat:     Mouth: Mucous membranes are moist.     Pharynx: Oropharynx is clear.  Eyes:      General:        Right eye: No discharge.        Left eye: No discharge.     Extraocular Movements: Extraocular movements intact.     Conjunctiva/sclera: Conjunctivae normal.     Pupils: Pupils are equal, round, and reactive to light.  Cardiovascular:     Rate and Rhythm: Normal rate and regular rhythm.     Heart sounds: No murmur heard. Pulmonary:     Effort: Pulmonary effort is normal. No respiratory distress.     Breath sounds: Normal breath sounds. No wheezing or rales.  Musculoskeletal:     Cervical back: Normal range of motion and neck supple.     Comments: Right hand swelling, bruising and decreased range of motion.  Skin:    General: Skin is warm and dry.     Capillary Refill: Capillary refill takes less than 2 seconds.  Neurological:     General: No focal deficit present.     Mental Status: She is alert and oriented to person, place, and time. Mental status  is at baseline.  Psychiatric:        Mood and Affect: Mood normal.        Behavior: Behavior normal.        Thought Content: Thought content normal.        Judgment: Judgment normal.     Results for orders placed or performed in visit on 02/08/21  CBC with Differential/Platelet  Result Value Ref Range   WBC 8.5 3.4 - 10.8 x10E3/uL   RBC 4.84 3.77 - 5.28 x10E6/uL   Hemoglobin 14.7 11.1 - 15.9 g/dL   Hematocrit 44.2 34.0 - 46.6 %   MCV 91 79 - 97 fL   MCH 30.4 26.6 - 33.0 pg   MCHC 33.3 31.5 - 35.7 g/dL   RDW 12.3 11.7 - 15.4 %   Platelets 255 150 - 450 x10E3/uL   Neutrophils 45 Not Estab. %   Lymphs 48 Not Estab. %   Monocytes 5 Not Estab. %   Eos 1 Not Estab. %   Basos 1 Not Estab. %   Neutrophils Absolute 3.8 1.4 - 7.0 x10E3/uL   Lymphocytes Absolute 4.1 (H) 0.7 - 3.1 x10E3/uL   Monocytes Absolute 0.4 0.1 - 0.9 x10E3/uL   EOS (ABSOLUTE) 0.0 0.0 - 0.4 x10E3/uL   Basophils Absolute 0.1 0.0 - 0.2 x10E3/uL   Immature Granulocytes 0 Not Estab. %   Immature Grans (Abs) 0.0 0.0 - 0.1 x10E3/uL  Comprehensive  metabolic panel  Result Value Ref Range   Glucose 82 70 - 99 mg/dL   BUN 20 6 - 24 mg/dL   Creatinine, Ser 1.25 (H) 0.57 - 1.00 mg/dL   eGFR 50 (L) >59 mL/min/1.73   BUN/Creatinine Ratio 16 9 - 23   Sodium 142 134 - 144 mmol/L   Potassium 4.3 3.5 - 5.2 mmol/L   Chloride 100 96 - 106 mmol/L   CO2 26 20 - 29 mmol/L   Calcium 9.8 8.7 - 10.2 mg/dL   Total Protein 7.1 6.0 - 8.5 g/dL   Albumin 5.0 (H) 3.8 - 4.9 g/dL   Globulin, Total 2.1 1.5 - 4.5 g/dL   Albumin/Globulin Ratio 2.4 (H) 1.2 - 2.2   Bilirubin Total 0.4 0.0 - 1.2 mg/dL   Alkaline Phosphatase 83 44 - 121 IU/L   AST 25 0 - 40 IU/L   ALT 18 0 - 32 IU/L  Lipid Panel w/o Chol/HDL Ratio  Result Value Ref Range   Cholesterol, Total 275 (H) 100 - 199 mg/dL   Triglycerides 103 0 - 149 mg/dL   HDL 80 >39 mg/dL   VLDL Cholesterol Cal 18 5 - 40 mg/dL   LDL Chol Calc (NIH) 177 (H) 0 - 99 mg/dL  Urinalysis, Routine w reflex microscopic  Result Value Ref Range   Specific Gravity, UA 1.007 1.005 - 1.030   pH, UA 7.5 5.0 - 7.5   Color, UA Yellow Yellow   Appearance Ur Clear Clear   Leukocytes,UA Negative Negative   Protein,UA Negative Negative/Trace   Glucose, UA Negative Negative   Ketones, UA Negative Negative   RBC, UA Negative Negative   Bilirubin, UA Negative Negative   Urobilinogen, Ur 0.2 0.2 - 1.0 mg/dL   Nitrite, UA Negative Negative   Microscopic Examination Comment   TSH  Result Value Ref Range   TSH 3.850 0.450 - 4.500 uIU/mL      Assessment & Plan:   Problem List Items Addressed This Visit   None Visit Diagnoses     Right hand pain    -  Primary   Due to swelling, pain, and brusing. Recommend patient be seen in Ortho UC to have xrays and possible cast. Will give pain meds if UC does not.         Follow up plan: No follow-ups on file.

## 2021-12-03 LAB — HM MAMMOGRAPHY

## 2022-01-30 ENCOUNTER — Encounter: Payer: Self-pay | Admitting: Family Medicine

## 2022-01-30 ENCOUNTER — Ambulatory Visit: Admitting: Family Medicine

## 2022-01-30 VITALS — BP 113/76 | HR 69 | Temp 98.4°F | Ht 63.5 in | Wt 141.4 lb

## 2022-01-30 DIAGNOSIS — Z23 Encounter for immunization: Secondary | ICD-10-CM | POA: Diagnosis not present

## 2022-01-30 DIAGNOSIS — R195 Other fecal abnormalities: Secondary | ICD-10-CM | POA: Diagnosis not present

## 2022-01-30 NOTE — Progress Notes (Signed)
BP 113/76   Pulse 69   Temp 98.4 F (36.9 C) (Oral)   Ht 5' 3.5" (1.613 m)   Wt 141 lb 6.4 oz (64.1 kg)   LMP 07/26/2010 (Approximate)   SpO2 100%   BMI 24.65 kg/m    Subjective:    Patient ID: Sabrina Baxter, female    DOB: [birth year], 56 y.o.   MRN: [id number]  HPI: Sabrina Baxter is a 56 y.o. female  Chief Complaint  Patient presents with   GI Problem    Patient is here for Digestive Problems. Patient says after bowel movements she notices mucus comes out. Patient declines having any family history of GI issues. Patient says she is taking medication for GERD.    ABDOMINAL ISSUES- has a BM in the AM and notes that she has been having some mucous that comes out about an hour afterwards, not associated with a BM Duration: year + Frequency: daily Treatments attempted: none Constipation: no Diarrhea: no Mucous in the stool: yes Heartburn: no Bloating:no Flatulence: no Nausea: no Vomiting: no Melena or hematochezia: no Rash: no Jaundice: no Fever: no Weight loss: no  Relevant past medical, surgical, family and social history reviewed and updated as indicated. Interim medical history since our last visit reviewed. Allergies and medications reviewed and updated.  Review of Systems  Constitutional: Negative.   Respiratory: Negative.    Cardiovascular: Negative.   Gastrointestinal: Negative.  Negative for abdominal distention, abdominal pain, anal bleeding, blood in stool, constipation, diarrhea, nausea, rectal pain and vomiting.       Mucous in stool  Musculoskeletal: Negative.   Skin: Negative.   Psychiatric/Behavioral: Negative.      Per HPI unless specifically indicated above     Objective:    BP 113/76   Pulse 69   Temp 98.4 F (36.9 C) (Oral)   Ht 5' 3.5" (1.613 m)   Wt 141 lb 6.4 oz (64.1 kg)   LMP 07/26/2010 (Approximate)   SpO2 100%   BMI 24.65 kg/m   Wt Readings from Last 3 Encounters:  01/30/22 141 lb 6.4 oz (64.1 kg)  09/03/21 147 lb 9.6 oz  (67 kg)  02/08/21 144 lb 3.2 oz (65.4 kg)    Physical Exam Vitals and nursing note reviewed.  Constitutional:      General: She is not in acute distress.    Appearance: Normal appearance. She is normal weight. She is not ill-appearing, toxic-appearing or diaphoretic.  HENT:     Head: Normocephalic and atraumatic.     Right Ear: External ear normal.     Left Ear: External ear normal.     Nose: Nose normal.     Mouth/Throat:     Mouth: Mucous membranes are moist.     Pharynx: Oropharynx is clear.  Eyes:     General: No scleral icterus.       Right eye: No discharge.        Left eye: No discharge.     Extraocular Movements: Extraocular movements intact.     Conjunctiva/sclera: Conjunctivae normal.     Pupils: Pupils are equal, round, and reactive to light.  Cardiovascular:     Rate and Rhythm: Normal rate and regular rhythm.     Pulses: Normal pulses.     Heart sounds: Normal heart sounds. No murmur heard.    No friction rub. No gallop.  Pulmonary:     Effort: Pulmonary effort is normal. No respiratory distress.     Breath sounds: Normal breath sounds.  No stridor. No wheezing, rhonchi or rales.  Chest:     Chest wall: No tenderness.  Abdominal:     General: Abdomen is flat. Bowel sounds are normal. There is no distension.     Palpations: Abdomen is soft. There is no mass.     Tenderness: There is no abdominal tenderness.     Hernia: No hernia is present.  Musculoskeletal:        General: Normal range of motion.     Cervical back: Normal range of motion and neck supple.  Skin:    General: Skin is warm and dry.     Capillary Refill: Capillary refill takes less than 2 seconds.     Coloration: Skin is not jaundiced or pale.     Findings: No bruising, erythema, lesion or rash.  Neurological:     General: No focal deficit present.     Mental Status: She is alert and oriented to person, place, and time. Mental status is at baseline.  Psychiatric:        Mood and Affect: Mood  normal.        Behavior: Behavior normal.        Thought Content: Thought content normal.        Judgment: Judgment normal.     Results for orders placed or performed in visit on 12/04/21  HM MAMMOGRAPHY  Result Value Ref Range   HM Mammogram 0-4 Bi-Rad 0-4 Bi-Rad, Self Reported Normal      Assessment & Plan:   Problem List Items Addressed This Visit   None Visit Diagnoses     Change in stool    -  Primary   Discussed mucous in stool with no other symptoms or discomfort. Offered stool studies, but she will hold at this time. Reassured patient. Continue to monitor.   Needs flu shot       Flu shot given today.   Relevant Orders   Flu Vaccine QUAD 6+ mos PF IM (Fluarix Quad PF)        Follow up plan: Return as scheduled.

## 2022-02-10 ENCOUNTER — Encounter: Admitting: Family Medicine

## 2022-03-17 ENCOUNTER — Encounter: Payer: Self-pay | Admitting: Family Medicine

## 2022-03-17 ENCOUNTER — Other Ambulatory Visit (HOSPITAL_COMMUNITY)
Admission: RE | Admit: 2022-03-17 | Discharge: 2022-03-17 | Disposition: A | Discharge status: Home / Self Care | Source: Ambulatory Visit | Attending: Family Medicine | Admitting: Family Medicine

## 2022-03-17 ENCOUNTER — Ambulatory Visit (INDEPENDENT_AMBULATORY_CARE_PROVIDER_SITE_OTHER): Admitting: Family Medicine

## 2022-03-17 VITALS — BP 102/70 | HR 76 | Temp 98.2°F | Ht 63.0 in | Wt 144.3 lb

## 2022-03-17 DIAGNOSIS — Z0000 Encounter for general adult medical examination without abnormal findings: Secondary | ICD-10-CM | POA: Insufficient documentation

## 2022-03-17 LAB — URINALYSIS, ROUTINE W REFLEX MICROSCOPIC
Bilirubin, UA: NEGATIVE
Glucose, UA: NEGATIVE
Ketones, UA: NEGATIVE
Leukocytes,UA: NEGATIVE
Nitrite, UA: NEGATIVE
Protein,UA: NEGATIVE
RBC, UA: NEGATIVE
Specific Gravity, UA: <1.005 — ABNORMAL LOW (ref 1.005–1.030)
Urobilinogen, Ur: 0.2 mg/dL (ref 0.2–1.0)
pH, UA: 5 (ref 5.0–7.5)

## 2022-03-17 MED ORDER — PANTOPRAZOLE SODIUM 40 MG PO TBEC: 40.0000 mg | DELAYED_RELEASE_TABLET | Freq: Every day | ORAL | 3 refills | Status: DC

## 2022-03-17 MED ORDER — CROMOLYN SODIUM 4 % OP SOLN: 2.0000 [drp] | Freq: Four times a day (QID) | OPHTHALMIC | 12 refills | Status: DC

## 2022-03-17 NOTE — Progress Notes (Signed)
BP 102/70   Pulse 76   Temp 98.2 F (36.8 C) (Oral)   Ht '5\' 3"'$  (1.6 m)   Wt 144 lb 4.8 oz (65.5 kg)   LMP 07/26/2010 (Approximate)   SpO2 98%   BMI 25.56 kg/m    Subjective:    Patient ID: Sabrina Baxter, female    DOB: [birth year], 57 y.o.   MRN: [id number]  HPI: Sabrina Baxter is a 57 y.o. female presenting on 03/17/2022 for comprehensive medical examination. Current medical complaints include:   GERD GERD control status: controlled Satisfied with current treatment? yes Heartburn frequency: rarely Medication side effects: no  Medication compliance: excellent Dysphagia: no Odynophagia:  no Hematemesis: no Blood in stool: no EGD: yes  She currently lives with: husband Menopausal Symptoms: no  Depression Screen done today and results listed below:     03/17/2022    3:20 PM 01/30/2022    8:47 AM 02/08/2021    9:55 AM 01/06/2020    8:04 AM 06/18/2017    3:15 PM  Depression screen PHQ 2/9  Decreased Interest 0 0 0 0 0  Down, Depressed, Hopeless 0 0 0 0 0  PHQ - 2 Score 0 0 0 0 0  Altered sleeping 0 0 1    Tired, decreased energy 0 0 0    Change in appetite 0 0 0    Feeling bad or failure about yourself  0 0 0    Trouble concentrating 0 0 0    Moving slowly or fidgety/restless 0 0 0    Suicidal thoughts 0 0 0    PHQ-9 Score 0 0 1    Difficult doing work/chores Not difficult at all Not difficult at all Not difficult at all      Past Medical History:  Past Medical History:  Diagnosis Date   Abnormal thyroid function test    Actinic keratosis    Atypical mole 10/13/2017   Left mid tricep    Atypical mole 01/01/2010   right sup lat calf    Atypical mole 01/01/2010   left post lat thigh above popliteal    Atypical mole 05/09/2009   right scapula    Atypical mole 01/03/2009   xyphoid    Chronic neck pain    Chronic renal failure    worsened with NSAIDs   Disorder of bursae and tendons in shoulder region    Fibroid    57yo   Gastritis    GERD  (gastroesophageal reflux disease)    Hyperreflexia    Pelvic pain syndrome    Pudendal neuralgia    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   Uterine fibroid     Surgical History:  Past Surgical History:  Procedure Laterality Date   APPENDECTOMY     COLONOSCOPY WITH PROPOFOL N/A 01/22/2015   Procedure: COLONOSCOPY WITH PROPOFOL;  Surgeon: Lollie Sails, MD;  Location: [facility] ENDOSCOPY;  Service: Endoscopy;  Laterality: N/A;   ESOPHAGOGASTRODUODENOSCOPY (EGD) WITH PROPOFOL N/A 01/22/2015   Procedure: ESOPHAGOGASTRODUODENOSCOPY (EGD) WITH PROPOFOL;  Surgeon: Lollie Sails, MD;  Location[facility] ENDOSCOPY;  Service: Endoscopy;  Laterality: N/A;   NISSEN FUNDOPLICATION  18/56/3149   TOTAL ABDOMINAL HYSTERECTOMY  02/24/2010   hemorrhaging   UTERINE FIBROID SURGERY  02/24/1981    Medications:  Current Outpatient Medications on File Prior to Visit  Medication Sig   Cholecalciferol (VITAMIN D3) 2000 UNITS capsule Take 2,000 Units by mouth daily.   Magnesium 250 MG TABS Take 250 mg by mouth  daily.   Multiple Vitamin (MULTI-DAY) TABS Take 1 tablet by mouth daily.   No current facility-administered medications on file prior to visit.    Allergies:  Allergies  Allergen Reactions   Amoxicillin    Diclofenac    Mobic [Meloxicam]     Social History:  Social History   Socioeconomic History   Marital status: Married    Spouse name: Not on file   Number of children: Not on file   Years of education: Not on file   Highest education level: Not on file  Occupational History   Not on file  Tobacco Use   Smoking status: Never   Smokeless tobacco: Never  Vaping Use   Vaping Use: Never used  Substance and Sexual Activity   Alcohol use: No   Drug use: No   Sexual activity: Yes    Birth control/protection: Surgical  Other Topics Concern   Not on file  Social History Narrative   Not on file   Social Determinants of Health   Financial Resource Strain: Not on file  Food Insecurity:  Not on file  Transportation Needs: Not on file  Physical Activity: Not on file  Stress: Not on file  Social Connections: Not on file  Intimate Partner Violence: Not on file   Social History   Tobacco Use  Smoking Status Never  Smokeless Tobacco Never   Social History   Substance and Sexual Activity  Alcohol Use No    Family History:  Family History  Problem Relation Age of Onset   Arthritis Mother        rheumatoid   Depression Mother    Lymphoma Mother    Hypertension Sister    Thyroid disease Brother    Bladder Cancer Brother    Breast cancer Neg Hx     Past medical history, surgical history, medications, allergies, family history and social history reviewed with patient today and changes made to appropriate areas of the chart.   Review of Systems  Constitutional: Negative.   HENT: Negative.    Eyes: Negative.   Respiratory: Negative.    Cardiovascular: Negative.   Gastrointestinal: Negative.   Genitourinary: Negative.   Musculoskeletal: Negative.   Skin: Negative.   Neurological: Negative.   Endo/Heme/Allergies:  Positive for environmental allergies. Negative for polydipsia. Does not bruise/bleed easily.  Psychiatric/Behavioral: Negative.     All other ROS negative except what is listed above and in the HPI.      Objective:    BP 102/70   Pulse 76   Temp 98.2 F (36.8 C) (Oral)   Ht '5\' 3"'$  (1.6 m)   Wt 144 lb 4.8 oz (65.5 kg)   LMP 07/26/2010 (Approximate)   SpO2 98%   BMI 25.56 kg/m   Wt Readings from Last 3 Encounters:  03/17/22 144 lb 4.8 oz (65.5 kg)  01/30/22 141 lb 6.4 oz (64.1 kg)  09/03/21 147 lb 9.6 oz (67 kg)    Physical Exam Vitals and nursing note reviewed.  Constitutional:      General: She is not in acute distress.    Appearance: Normal appearance. She is normal weight. She is not ill-appearing, toxic-appearing or diaphoretic.  HENT:     Head: Normocephalic and atraumatic.     Right Ear: Tympanic membrane, ear canal and  external ear normal. There is no impacted cerumen.     Left Ear: Tympanic membrane, ear canal and external ear normal. There is no impacted cerumen.     Nose: Nose  normal. No congestion or rhinorrhea.     Mouth/Throat:     Mouth: Mucous membranes are moist.     Pharynx: Oropharynx is clear. No oropharyngeal exudate or posterior oropharyngeal erythema.  Eyes:     General: No scleral icterus.       Right eye: No discharge.        Left eye: No discharge.     Extraocular Movements: Extraocular movements intact.     Conjunctiva/sclera: Conjunctivae normal.     Pupils: Pupils are equal, round, and reactive to light.  Neck:     Vascular: No carotid bruit.  Cardiovascular:     Rate and Rhythm: Normal rate and regular rhythm.     Pulses: Normal pulses.     Heart sounds: No murmur heard.    No friction rub. No gallop.  Pulmonary:     Effort: Pulmonary effort is normal. No respiratory distress.     Breath sounds: Normal breath sounds. No stridor. No wheezing, rhonchi or rales.  Chest:     Chest wall: No tenderness.  Breasts:    Left: Normal.  Abdominal:     General: Abdomen is flat. Bowel sounds are normal. There is no distension.     Palpations: Abdomen is soft. There is no mass.     Tenderness: There is no abdominal tenderness. There is no right CVA tenderness, left CVA tenderness, guarding or rebound.     Hernia: No hernia is present.  Genitourinary:    General: Normal vulva.     Labia:        Right: No rash, tenderness, lesion or injury.        Left: No rash, tenderness, lesion or injury.      Cervix: Normal.     Uterus: Normal.      Adnexa: Right adnexa normal and left adnexa normal.  Musculoskeletal:        General: No swelling, tenderness, deformity or signs of injury.     Cervical back: Normal range of motion and neck supple. No rigidity. No muscular tenderness.     Right lower leg: No edema.     Left lower leg: No edema.  Lymphadenopathy:     Cervical: No cervical  adenopathy.  Skin:    General: Skin is warm and dry.     Capillary Refill: Capillary refill takes less than 2 seconds.     Coloration: Skin is not jaundiced or pale.     Findings: No bruising, erythema, lesion or rash.  Neurological:     General: No focal deficit present.     Mental Status: She is alert and oriented to person, place, and time. Mental status is at baseline.     Cranial Nerves: No cranial nerve deficit.     Sensory: No sensory deficit.     Motor: No weakness.     Coordination: Coordination normal.     Gait: Gait normal.     Deep Tendon Reflexes: Reflexes normal.  Psychiatric:        Mood and Affect: Mood normal.        Behavior: Behavior normal.        Thought Content: Thought content normal.        Judgment: Judgment normal.     Results for orders placed or performed in visit on 12/04/21  HM MAMMOGRAPHY  Result Value Ref Range   HM Mammogram 0-4 Bi-Rad 0-4 Bi-Rad, Self Reported Normal      Assessment & Plan:   Problem List Items  Addressed This Visit   None Visit Diagnoses     Routine general medical examination at a health care facility    -  Primary   Vaccines up to date. Screening labs checked today. Pap done. Mammo and Colonoscopy up to date. Continue diet and exercise. Call with any concerns.   Relevant Orders   CBC with Differential/Platelet   Comprehensive metabolic panel   Lipid Panel w/o Chol/HDL Ratio   Urinalysis, Routine w reflex microscopic   TSH   HIV Antibody (routine testing w rflx)   Cytology - PAP        Follow up plan: Return in about 1 year (around 03/18/2023), or physical.   LABORATORY TESTING:  - Pap smear: pap done  IMMUNIZATIONS:   - Tdap: Tetanus vaccination status reviewed: last tetanus booster within 10 years. - Influenza: Up to date - Pneumovax: Not applicable - Prevnar: Not applicable - COVID: Up to date - HPV: Not applicable - Shingrix vaccine: Up to date  SCREENING: -Mammogram: Up to date  - Colonoscopy:  Up to date   PATIENT COUNSELING:   Advised to take 1 mg of folate supplement per day if capable of pregnancy.   Sexuality: Discussed sexually transmitted diseases, partner selection, use of condoms, avoidance of unintended pregnancy  and contraceptive alternatives.   Advised to avoid cigarette smoking.  I discussed with the patient that most people either abstain from alcohol or drink within safe limits (<=14/week and <=4 drinks/occasion for males, <=7/weeks and <= 3 drinks/occasion for females) and that the risk for alcohol disorders and other health effects rises proportionally with the number of drinks per week and how often a drinker exceeds daily limits.  Discussed cessation/primary prevention of drug use and availability of treatment for abuse.   Diet: Encouraged to adjust caloric intake to maintain  or achieve ideal body weight, to reduce intake of dietary saturated fat and total fat, to limit sodium intake by avoiding high sodium foods and not adding table salt, and to maintain adequate dietary potassium and calcium preferably from fresh fruits, vegetables, and low-fat dairy products.    stressed the importance of regular exercise  Injury prevention: Discussed safety belts, safety helmets, smoke detector, smoking near bedding or upholstery.   Dental health: Discussed importance of regular tooth brushing, flossing, and dental visits.    NEXT PREVENTATIVE PHYSICAL DUE IN 1 YEAR. Return in about 1 year (around 03/18/2023), or physical.

## 2022-03-18 LAB — COMPREHENSIVE METABOLIC PANEL
ALT: 15 IU/L (ref 0–32)
AST: 23 IU/L (ref 0–40)
Albumin/Globulin Ratio: 2.8 — ABNORMAL HIGH (ref 1.2–2.2)
Albumin: 4.4 g/dL (ref 3.8–4.9)
Alkaline Phosphatase: 86 IU/L (ref 44–121)
BUN/Creatinine Ratio: 17 (ref 9–23)
BUN: 22 mg/dL (ref 6–24)
Bilirubin Total: 0.2 mg/dL (ref 0.0–1.2)
CO2: 24 mmol/L (ref 20–29)
Calcium: 9.1 mg/dL (ref 8.7–10.2)
Chloride: 103 mmol/L (ref 96–106)
Creatinine, Ser: 1.26 mg/dL — ABNORMAL HIGH (ref 0.57–1.00)
Globulin, Total: 1.6 g/dL (ref 1.5–4.5)
Glucose: 115 mg/dL — ABNORMAL HIGH (ref 70–99)
Potassium: 4.1 mmol/L (ref 3.5–5.2)
Sodium: 141 mmol/L (ref 134–144)
Total Protein: 6 g/dL (ref 6.0–8.5)
eGFR: 49 mL/min/{1.73_m2} — ABNORMAL LOW (ref >59)

## 2022-03-18 LAB — CBC WITH DIFFERENTIAL/PLATELET
Basophils Absolute: 0.1 10*3/uL (ref 0.0–0.2)
Basos: 1 %
EOS (ABSOLUTE): 0.1 10*3/uL (ref 0.0–0.4)
Eos: 1 %
Hematocrit: 38.6 % (ref 34.0–46.6)
Hemoglobin: 12.7 g/dL (ref 11.1–15.9)
Immature Grans (Abs): 0 10*3/uL (ref 0.0–0.1)
Immature Granulocytes: 0 %
Lymphocytes Absolute: 4.6 10*3/uL — ABNORMAL HIGH (ref 0.7–3.1)
Lymphs: 51 %
MCH: 30 pg (ref 26.6–33.0)
MCHC: 32.9 g/dL (ref 31.5–35.7)
MCV: 91 fL (ref 79–97)
Monocytes Absolute: 0.5 10*3/uL (ref 0.1–0.9)
Monocytes: 6 %
Neutrophils Absolute: 3.6 10*3/uL (ref 1.4–7.0)
Neutrophils: 41 %
Platelets: 234 10*3/uL (ref 150–450)
RBC: 4.24 x10E6/uL (ref 3.77–5.28)
RDW: 12 % (ref 11.7–15.4)
WBC: 8.9 10*3/uL (ref 3.4–10.8)

## 2022-03-18 LAB — LIPID PANEL W/O CHOL/HDL RATIO
Cholesterol, Total: 233 mg/dL — ABNORMAL HIGH (ref 100–199)
HDL: 71 mg/dL (ref >39)
LDL Chol Calc (NIH): 138 mg/dL — ABNORMAL HIGH (ref 0–99)
Triglycerides: 138 mg/dL (ref 0–149)
VLDL Cholesterol Cal: 24 mg/dL (ref 5–40)

## 2022-03-18 LAB — TSH: TSH: 3.92 u[IU]/mL (ref 0.450–4.500)

## 2022-03-18 LAB — HIV ANTIBODY (ROUTINE TESTING W REFLEX): HIV Screen 4th Generation wRfx: NONREACTIVE

## 2022-03-22 LAB — CYTOLOGY - PAP
Comment: NEGATIVE
Diagnosis: NEGATIVE
High risk HPV: NEGATIVE

## 2023-03-15 ENCOUNTER — Other Ambulatory Visit: Payer: Self-pay | Admitting: Family Medicine

## 2023-03-16 NOTE — Telephone Encounter (Signed)
Requested Prescriptions  Pending Prescriptions Disp Refills   pantoprazole (PROTONIX) 40 MG tablet [Pharmacy Med Name: PANTOPRAZOLE SOD DR 40 MG TAB] 90 tablet 3    Sig: TAKE 1 TABLET BY MOUTH EVERY DAY     Gastroenterology: Proton Pump Inhibitors Failed - 03/16/2023  1:12 PM      Failed - Valid encounter within last 12 months    Recent Outpatient Visits           12 months ago Routine general medical examination at a health care facility   [facility] [place], [place] P, DO   1 year ago Change in stool   [facility] [place], [place] P, DO   1 year ago Right hand pain   [facility] Larae Grooms, NP   2 years ago Routine general medical examination at a health care facility   [facility] [place], [place] P, DO   2 years ago Right hip pain   [facility] Dorcas Carrow, DO       Future Appointments             In 3 days Dorcas Carrow, DO [facility]

## 2023-03-19 ENCOUNTER — Ambulatory Visit (INDEPENDENT_AMBULATORY_CARE_PROVIDER_SITE_OTHER): Admitting: Family Medicine

## 2023-03-19 ENCOUNTER — Encounter: Payer: Self-pay | Admitting: Family Medicine

## 2023-03-19 VITALS — BP 116/79 | HR 65 | Temp 98.2°F | Ht 63.5 in | Wt 147.8 lb

## 2023-03-19 DIAGNOSIS — Z0000 Encounter for general adult medical examination without abnormal findings: Secondary | ICD-10-CM | POA: Diagnosis not present

## 2023-03-19 DIAGNOSIS — Z1231 Encounter for screening mammogram for malignant neoplasm of breast: Secondary | ICD-10-CM

## 2023-03-19 DIAGNOSIS — Z23 Encounter for immunization: Secondary | ICD-10-CM

## 2023-03-19 DIAGNOSIS — R102 Pelvic and perineal pain: Secondary | ICD-10-CM

## 2023-03-19 MED ORDER — PANTOPRAZOLE SODIUM 20 MG PO TBEC: 20.0000 mg | DELAYED_RELEASE_TABLET | Freq: Every day | ORAL | 1 refills | Status: DC

## 2023-03-19 MED ORDER — CROMOLYN SODIUM 4 % OP SOLN: 2.0000 [drp] | Freq: Four times a day (QID) | OPHTHALMIC | 12 refills | Status: DC

## 2023-03-19 MED ORDER — CROMOLYN SODIUM 4 % OP SOLN: 2.0000 [drp] | Freq: Four times a day (QID) | OPHTHALMIC | 0 refills | Status: DC

## 2023-03-19 NOTE — Progress Notes (Signed)
BP 116/79   Pulse 65   Temp 98.2 F (36.8 C) (Oral)   Ht 5' 3.5" (1.613 m)   Wt 147 lb 12.8 oz (67 kg)   LMP 07/26/2010 (Approximate)   SpO2 100%   BMI 25.77 kg/m    Subjective:    Patient ID: Sabrina Baxter, female    DOB: 1965-07-13, 58 y.o.   MRN: [id number]  HPI: Sabrina Baxter is a 58 y.o. female presenting on 03/19/2023 for comprehensive medical examination. Current medical complaints include:  Hip has been acting up a bit more. She's consistently doing her PT at home and has been doing OK, but it is acting up again and she'd like to do something different.   She currently lives with: husband Menopausal Symptoms: no  Depression Screen done today and results listed below:     03/19/2023    9:55 AM 03/17/2022    3:20 PM 01/30/2022    8:47 AM 02/08/2021    9:55 AM 01/06/2020    8:04 AM  Depression screen PHQ 2/9  Decreased Interest 0 0 0 0 0  Down, Depressed, Hopeless 0 0 0 0 0  PHQ - 2 Score 0 0 0 0 0  Altered sleeping 1 0 0 1   Tired, decreased energy 0 0 0 0   Change in appetite 0 0 0 0   Feeling bad or failure about yourself  0 0 0 0   Trouble concentrating 0 0 0 0   Moving slowly or fidgety/restless 0 0 0 0   Suicidal thoughts 0 0 0 0   PHQ-9 Score 1 0 0 1   Difficult doing work/chores Not difficult at all Not difficult at all Not difficult at all Not difficult at all     Past Medical History:  Past Medical History:  Diagnosis Date   Abnormal thyroid function test    Actinic keratosis    Atypical mole 10/13/2017   Left mid tricep    Atypical mole 01/01/2010   right sup lat calf    Atypical mole 01/01/2010   left post lat thigh above popliteal    Atypical mole 05/09/2009   right scapula    Atypical mole 01/03/2009   xyphoid    Chronic neck pain    Chronic renal failure    worsened with NSAIDs   Disorder of bursae and tendons in shoulder region    Fibroid    58yo   Gastritis    GERD (gastroesophageal reflux disease)    Hyperreflexia    Pelvic  pain syndrome    Pudendal neuralgia    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   Uterine fibroid     Surgical History:  Past Surgical History:  Procedure Laterality Date   APPENDECTOMY     COLONOSCOPY WITH PROPOFOL N/A 01/22/2015   Procedure: COLONOSCOPY WITH PROPOFOL;  Surgeon: Christena Deem, MD;  Location: [facility] ENDOSCOPY;  Service: Endoscopy;  Laterality: N/A;   ESOPHAGOGASTRODUODENOSCOPY (EGD) WITH PROPOFOL N/A 01/22/2015   Procedure: ESOPHAGOGASTRODUODENOSCOPY (EGD) WITH PROPOFOL;  Surgeon: Christena Deem, MD;  Location: [facility] ENDOSCOPY;  Service: Endoscopy;  Laterality: N/A;   NISSEN FUNDOPLICATION  02/24/2001   TOTAL ABDOMINAL HYSTERECTOMY  02/24/2010   hemorrhaging   UTERINE FIBROID SURGERY  02/24/1981    Medications:  Current Outpatient Medications on File Prior to Visit  Medication Sig   Cholecalciferol (VITAMIN D3) 2000 UNITS capsule Take 2,000 Units by mouth daily.   Magnesium 250 MG TABS Take 250 mg  by mouth daily.   Multiple Vitamin (MULTI-DAY) TABS Take 1 tablet by mouth daily.   No current facility-administered medications on file prior to visit.    Allergies:  Allergies  Allergen Reactions   Amoxicillin    Diclofenac    Mobic [Meloxicam]     Social History:  Social History   Socioeconomic History   Marital status: Married    Spouse name: Not on file   Number of children: Not on file   Years of education: Not on file   Highest education level: Not on file  Occupational History   Not on file  Tobacco Use   Smoking status: Never   Smokeless tobacco: Never  Vaping Use   Vaping status: Never Used  Substance and Sexual Activity   Alcohol use: No   Drug use: No   Sexual activity: Yes    Birth control/protection: Surgical  Other Topics Concern   Not on file  Social History Narrative   Not on file   Social Drivers of Health   Financial Resource Strain: Not on file  Food Insecurity: Not on file  Transportation Needs: Not on file  Physical  Activity: Not on file  Stress: Not on file  Social Connections: Not on file  Intimate Partner Violence: Not on file   Social History   Tobacco Use  Smoking Status Never  Smokeless Tobacco Never   Social History   Substance and Sexual Activity  Alcohol Use No    Family History:  Family History  Problem Relation Age of Onset   Arthritis Mother        rheumatoid   Depression Mother    Lymphoma Mother    Hypertension Sister    Thyroid disease Brother    Bladder Cancer Brother    Breast cancer Neg Hx     Past medical history, surgical history, medications, allergies, family history and social history reviewed with patient today and changes made to appropriate areas of the chart.   Review of Systems  Constitutional: Negative.   HENT: Negative.    Eyes: Negative.   Respiratory: Negative.    Cardiovascular: Negative.   Gastrointestinal: Negative.  Negative for abdominal pain, blood in stool, constipation, diarrhea, heartburn, melena, nausea and vomiting.  Genitourinary: Negative.   Musculoskeletal:  Positive for joint pain and myalgias. Negative for back pain, falls and neck pain.  Skin: Negative.   Neurological:  Positive for dizziness. Negative for tingling, tremors, sensory change, speech change, focal weakness, seizures, loss of consciousness, weakness and headaches.  Endo/Heme/Allergies: Negative.   Psychiatric/Behavioral: Negative.     All other ROS negative except what is listed above and in the HPI.      Objective:    BP 116/79   Pulse 65   Temp 98.2 F (36.8 C) (Oral)   Ht 5' 3.5" (1.613 m)   Wt 147 lb 12.8 oz (67 kg)   LMP 07/26/2010 (Approximate)   SpO2 100%   BMI 25.77 kg/m   Wt Readings from Last 3 Encounters:  03/19/23 147 lb 12.8 oz (67 kg)  03/17/22 144 lb 4.8 oz (65.5 kg)  01/30/22 141 lb 6.4 oz (64.1 kg)    Physical Exam Vitals and nursing note reviewed.  Constitutional:      General: She is not in acute distress.    Appearance: Normal  appearance. She is not ill-appearing, toxic-appearing or diaphoretic.  HENT:     Head: Normocephalic and atraumatic.     Right Ear: Tympanic membrane, ear canal  and external ear normal. There is no impacted cerumen.     Left Ear: Tympanic membrane, ear canal and external ear normal. There is no impacted cerumen.     Nose: Nose normal. No congestion or rhinorrhea.     Mouth/Throat:     Mouth: Mucous membranes are moist.     Pharynx: Oropharynx is clear. No oropharyngeal exudate or posterior oropharyngeal erythema.  Eyes:     General: No scleral icterus.       Right eye: No discharge.        Left eye: No discharge.     Extraocular Movements: Extraocular movements intact.     Conjunctiva/sclera: Conjunctivae normal.     Pupils: Pupils are equal, round, and reactive to light.  Neck:     Vascular: No carotid bruit.  Cardiovascular:     Rate and Rhythm: Normal rate and regular rhythm.     Pulses: Normal pulses.     Heart sounds: No murmur heard.    No friction rub. No gallop.  Pulmonary:     Effort: Pulmonary effort is normal. No respiratory distress.     Breath sounds: Normal breath sounds. No stridor. No wheezing, rhonchi or rales.  Chest:     Chest wall: No tenderness.  Abdominal:     General: Abdomen is flat. Bowel sounds are normal. There is no distension.     Palpations: Abdomen is soft. There is no mass.     Tenderness: There is no abdominal tenderness. There is no right CVA tenderness, left CVA tenderness, guarding or rebound.     Hernia: No hernia is present.  Genitourinary:    Comments: Breast and pelvic exams deferred with shared decision making Musculoskeletal:        General: No swelling, tenderness, deformity or signs of injury.     Cervical back: Normal range of motion and neck supple. No rigidity. No muscular tenderness.     Right lower leg: No edema.     Left lower leg: No edema.  Lymphadenopathy:     Cervical: No cervical adenopathy.  Skin:    General: Skin is  warm and dry.     Capillary Refill: Capillary refill takes less than 2 seconds.     Coloration: Skin is not jaundiced or pale.     Findings: No bruising, erythema, lesion or rash.  Neurological:     General: No focal deficit present.     Mental Status: She is alert and oriented to person, place, and time. Mental status is at baseline.     Cranial Nerves: No cranial nerve deficit.     Sensory: No sensory deficit.     Motor: No weakness.     Coordination: Coordination normal.     Gait: Gait normal.     Deep Tendon Reflexes: Reflexes normal.  Psychiatric:        Mood and Affect: Mood normal.        Behavior: Behavior normal.        Thought Content: Thought content normal.        Judgment: Judgment normal.     Results for orders placed or performed in visit on 03/17/22  Cytology - PAP   Collection Time: 03/17/22  3:19 PM  Result Value Ref Range   High risk HPV Negative    Adequacy Satisfactory for evaluation.    Diagnosis      - Negative for intraepithelial lesion or malignancy (NILM)   Comment Normal Reference Range HPV - Negative  Urinalysis, Routine w reflex microscopic   Collection Time: 03/17/22  3:21 PM  Result Value Ref Range   Specific Gravity, UA <1.005 (L) 1.005 - 1.030   pH, UA 5.0 5.0 - 7.5   Color, UA Yellow Yellow   Appearance Ur Clear Clear   Leukocytes,UA Negative Negative   Protein,UA Negative Negative/Trace   Glucose, UA Negative Negative   Ketones, UA Negative Negative   RBC, UA Negative Negative   Bilirubin, UA Negative Negative   Urobilinogen, Ur 0.2 0.2 - 1.0 mg/dL   Nitrite, UA Negative Negative   Microscopic Examination Comment   CBC with Differential/Platelet   Collection Time: 03/17/22  3:23 PM  Result Value Ref Range   WBC 8.9 3.4 - 10.8 x10E3/uL   RBC 4.24 3.77 - 5.28 x10E6/uL   Hemoglobin 12.7 11.1 - 15.9 g/dL   Hematocrit 27.2 53.6 - 46.6 %   MCV 91 79 - 97 fL   MCH 30.0 26.6 - 33.0 pg   MCHC 32.9 31.5 - 35.7 g/dL   RDW 64.4 03.4 -  74.2 %   Platelets 234 150 - 450 x10E3/uL   Neutrophils 41 Not Estab. %   Lymphs 51 Not Estab. %   Monocytes 6 Not Estab. %   Eos 1 Not Estab. %   Basos 1 Not Estab. %   Neutrophils Absolute 3.6 1.4 - 7.0 x10E3/uL   Lymphocytes Absolute 4.6 (H) 0.7 - 3.1 x10E3/uL   Monocytes Absolute 0.5 0.1 - 0.9 x10E3/uL   EOS (ABSOLUTE) 0.1 0.0 - 0.4 x10E3/uL   Basophils Absolute 0.1 0.0 - 0.2 x10E3/uL   Immature Granulocytes 0 Not Estab. %   Immature Grans (Abs) 0.0 0.0 - 0.1 x10E3/uL  Comprehensive metabolic panel   Collection Time: 03/17/22  3:23 PM  Result Value Ref Range   Glucose 115 (H) 70 - 99 mg/dL   BUN 22 6 - 24 mg/dL   Creatinine, Ser 5.95 (H) 0.57 - 1.00 mg/dL   eGFR 49 (L) >63 OV/FIE/3.32   BUN/Creatinine Ratio 17 9 - 23   Sodium 141 134 - 144 mmol/L   Potassium 4.1 3.5 - 5.2 mmol/L   Chloride 103 96 - 106 mmol/L   CO2 24 20 - 29 mmol/L   Calcium 9.1 8.7 - 10.2 mg/dL   Total Protein 6.0 6.0 - 8.5 g/dL   Albumin 4.4 3.8 - 4.9 g/dL   Globulin, Total 1.6 1.5 - 4.5 g/dL   Albumin/Globulin Ratio 2.8 (H) 1.2 - 2.2   Bilirubin Total 0.2 0.0 - 1.2 mg/dL   Alkaline Phosphatase 86 44 - 121 IU/L   AST 23 0 - 40 IU/L   ALT 15 0 - 32 IU/L  Lipid Panel w/o Chol/HDL Ratio   Collection Time: 03/17/22  3:23 PM  Result Value Ref Range   Cholesterol, Total 233 (H) 100 - 199 mg/dL   Triglycerides 951 0 - 149 mg/dL   HDL 71 >88 mg/dL   VLDL Cholesterol Cal 24 5 - 40 mg/dL   LDL Chol Calc (NIH) 416 (H) 0 - 99 mg/dL  TSH   Collection Time: 03/17/22  3:23 PM  Result Value Ref Range   TSH 3.920 0.450 - 4.500 uIU/mL  HIV Antibody (routine testing w rflx)   Collection Time: 03/17/22  3:23 PM  Result Value Ref Range   HIV Screen 4th Generation wRfx Non Reactive Non Reactive      Assessment & Plan:   Problem List Items Addressed This Visit  Other   Pelvic pain syndrome   Relevant Orders   Ambulatory referral to Orthopedic Surgery   Other Visit Diagnoses       Routine general  medical examination at a health care facility    -  Primary   Vaccines up to date. Screening labs checked today. Pap N/A. Mammogram ordered. Colonoscopy. Continue diet and exercise. Call with any concerns.   Relevant Orders   CBC with Differential/Platelet   Comprehensive metabolic panel   Lipid Panel w/o Chol/HDL Ratio   TSH     Flu vaccine need       Flu shot given today.   Relevant Orders   Flu vaccine trivalent PF, 6mos and older(Flulaval,Afluria,Fluarix,Fluzone) (Completed)     Encounter for screening mammogram for malignant neoplasm of breast       Mammo ordered today.   Relevant Orders   MM 3D SCREENING MAMMOGRAM BILATERAL BREAST        Follow up plan: Return in about 1 year (around 03/18/2024) for physical.   LABORATORY TESTING:  - Pap smear: up to date  IMMUNIZATIONS:   - Tdap: Tetanus vaccination status reviewed: last tetanus booster within 10 years. - Influenza: Administered today - Pneumovax: Up to date - COVID: Up to date - HPV: Not applicable - Shingrix vaccine: Up to date  SCREENING: -Mammogram: Up to date  - Colonoscopy: Up to date   PATIENT COUNSELING:   Advised to take 1 mg of folate supplement per day if capable of pregnancy.   Sexuality: Discussed sexually transmitted diseases, partner selection, use of condoms, avoidance of unintended pregnancy  and contraceptive alternatives.   Advised to avoid cigarette smoking.  I discussed with the patient that most people either abstain from alcohol or drink within safe limits (<=14/week and <=4 drinks/occasion for males, <=7/weeks and <= 3 drinks/occasion for females) and that the risk for alcohol disorders and other health effects rises proportionally with the number of drinks per week and how often a drinker exceeds daily limits.  Discussed cessation/primary prevention of drug use and availability of treatment for abuse.   Diet: Encouraged to adjust caloric intake to maintain  or achieve ideal body weight,  to reduce intake of dietary saturated fat and total fat, to limit sodium intake by avoiding high sodium foods and not adding table salt, and to maintain adequate dietary potassium and calcium preferably from fresh fruits, vegetables, and low-fat dairy products.    stressed the importance of regular exercise  Injury prevention: Discussed safety belts, safety helmets, smoke detector, smoking near bedding or upholstery.   Dental health: Discussed importance of regular tooth brushing, flossing, and dental visits.    NEXT PREVENTATIVE PHYSICAL DUE IN 1 YEAR. Return in about 1 year (around 03/18/2024) for physical.

## 2023-03-20 LAB — COMPREHENSIVE METABOLIC PANEL
ALT: 16 [IU]/L (ref 0–32)
AST: 24 [IU]/L (ref 0–40)
Albumin: 4.4 g/dL (ref 3.8–4.9)
Alkaline Phosphatase: 84 [IU]/L (ref 44–121)
BUN/Creatinine Ratio: 21 (ref 9–23)
BUN: 23 mg/dL (ref 6–24)
Bilirubin Total: 0.3 mg/dL (ref 0.0–1.2)
CO2: 23 mmol/L (ref 20–29)
Calcium: 9.2 mg/dL (ref 8.7–10.2)
Chloride: 102 mmol/L (ref 96–106)
Creatinine, Ser: 1.12 mg/dL — ABNORMAL HIGH (ref 0.57–1.00)
Globulin, Total: 1.6 g/dL (ref 1.5–4.5)
Glucose: 75 mg/dL (ref 70–99)
Potassium: 4.6 mmol/L (ref 3.5–5.2)
Sodium: 140 mmol/L (ref 134–144)
Total Protein: 6 g/dL (ref 6.0–8.5)
eGFR: 57 mL/min/{1.73_m2} — ABNORMAL LOW (ref >59)

## 2023-03-20 LAB — LIPID PANEL W/O CHOL/HDL RATIO
Cholesterol, Total: 235 mg/dL — ABNORMAL HIGH (ref 100–199)
HDL: 75 mg/dL (ref >39)
LDL Chol Calc (NIH): 140 mg/dL — ABNORMAL HIGH (ref 0–99)
Triglycerides: 117 mg/dL (ref 0–149)
VLDL Cholesterol Cal: 20 mg/dL (ref 5–40)

## 2023-03-20 LAB — CBC WITH DIFFERENTIAL/PLATELET
Basophils Absolute: 0.1 10*3/uL (ref 0.0–0.2)
Basos: 1 %
EOS (ABSOLUTE): 0.1 10*3/uL (ref 0.0–0.4)
Eos: 1 %
Hematocrit: 40.7 % (ref 34.0–46.6)
Hemoglobin: 13.3 g/dL (ref 11.1–15.9)
Immature Grans (Abs): 0 10*3/uL (ref 0.0–0.1)
Immature Granulocytes: 0 %
Lymphocytes Absolute: 4.1 10*3/uL — ABNORMAL HIGH (ref 0.7–3.1)
Lymphs: 47 %
MCH: 30.7 pg (ref 26.6–33.0)
MCHC: 32.7 g/dL (ref 31.5–35.7)
MCV: 94 fL (ref 79–97)
Monocytes Absolute: 0.5 10*3/uL (ref 0.1–0.9)
Monocytes: 5 %
Neutrophils Absolute: 4 10*3/uL (ref 1.4–7.0)
Neutrophils: 46 %
Platelets: 245 10*3/uL (ref 150–450)
RBC: 4.33 x10E6/uL (ref 3.77–5.28)
RDW: 12.4 % (ref 11.7–15.4)
WBC: 8.7 10*3/uL (ref 3.4–10.8)

## 2023-03-20 LAB — TSH: TSH: 4.83 u[IU]/mL — ABNORMAL HIGH (ref 0.450–4.500)

## 2023-03-22 ENCOUNTER — Encounter: Payer: Self-pay | Admitting: Family Medicine

## 2023-04-01 LAB — HM MAMMOGRAPHY

## 2023-04-02 ENCOUNTER — Encounter: Payer: Self-pay | Admitting: Family Medicine

## 2023-09-13 ENCOUNTER — Other Ambulatory Visit: Payer: Self-pay | Admitting: Family Medicine

## 2023-09-15 NOTE — Telephone Encounter (Signed)
 Requested Prescriptions  Pending Prescriptions Disp Refills   pantoprazole  (PROTONIX ) 20 MG tablet [Pharmacy Med Name: PANTOPRAZOLE  SOD DR 20 MG TAB] 90 tablet 1    Sig: TAKE 1 TABLET BY MOUTH EVERY DAY     Gastroenterology: Proton Pump Inhibitors Failed - 09/15/2023  1:45 PM      Failed - Valid encounter within last 12 months    Recent Outpatient Visits   None     Future Appointments             In 6 months Johnson, Duwaine SQUIBB, DO [facility], [facility]

## 2024-03-15 ENCOUNTER — Other Ambulatory Visit: Payer: Self-pay | Admitting: Family Medicine

## 2024-03-15 NOTE — Telephone Encounter (Signed)
 Requested Prescriptions  Pending Prescriptions Disp Refills   pantoprazole  (PROTONIX ) 20 MG tablet [Pharmacy Med Name: PANTOPRAZOLE  SOD DR 20 MG TAB] 90 tablet 0    Sig: TAKE 1 TABLET BY MOUTH EVERY DAY     Gastroenterology: Proton Pump Inhibitors Failed - 03/15/2024  2:24 PM      Failed - Valid encounter within last 12 months    Recent Outpatient Visits   None     Future Appointments             In 1 week Vicci Duwaine SQUIBB, DO [facility], [address]

## 2024-03-23 ENCOUNTER — Encounter: Payer: Self-pay | Admitting: Family Medicine

## 2024-03-23 ENCOUNTER — Ambulatory Visit: Payer: Self-pay | Admitting: Family Medicine

## 2024-03-23 VITALS — BP 122/84 | HR 65 | Temp 98.0°F | Ht 63.5 in | Wt 150.0 lb

## 2024-03-23 DIAGNOSIS — Z23 Encounter for immunization: Secondary | ICD-10-CM | POA: Diagnosis not present

## 2024-03-23 DIAGNOSIS — G8929 Other chronic pain: Secondary | ICD-10-CM | POA: Diagnosis not present

## 2024-03-23 DIAGNOSIS — M25561 Pain in right knee: Secondary | ICD-10-CM | POA: Diagnosis not present

## 2024-03-23 DIAGNOSIS — Z0000 Encounter for general adult medical examination without abnormal findings: Secondary | ICD-10-CM

## 2024-03-23 DIAGNOSIS — Z1231 Encounter for screening mammogram for malignant neoplasm of breast: Secondary | ICD-10-CM | POA: Diagnosis not present

## 2024-03-23 MED ORDER — CROMOLYN SODIUM 4 % OP SOLN: 2.0000 [drp] | Freq: Four times a day (QID) | OPHTHALMIC | 0 refills | Status: AC

## 2024-03-23 MED ORDER — PANTOPRAZOLE SODIUM 20 MG PO TBEC: 20.0000 mg | DELAYED_RELEASE_TABLET | Freq: Every day | ORAL | 3 refills | Status: AC

## 2024-03-23 NOTE — Progress Notes (Signed)
 "  BP 122/84   Pulse 65   Temp 98 F (36.7 C) (Oral)   Ht 5' 3.5 (1.613 m)   Wt 150 lb (68 kg)   LMP 07/26/2010   SpO2 98%   BMI 26.15 kg/m    Subjective:    Patient ID: Sabrina Baxter, female    DOB: Jul 04, 1965, 59 y.o.   MRN: [id number]  HPI: Sabrina Baxter is a 59 y.o. female presenting on 03/23/2024 for comprehensive medical examination. Current medical complaints include: Knee pain has continued. She is not able to walk or exercise the way she would like to. She had an MRI which didn't show much. Was seeing ortho, but didn't find them helpful. Pain is aching and occasionally sharp- in her R knee. Has been chronic over the past couple of years but worsening. She would like to be more active again and to know what's going on with it.   She currently lives with: husband Menopausal Symptoms: no  Depression Screen done today and results listed below:     03/23/2024    9:13 AM 03/19/2023    9:55 AM 03/17/2022    3:20 PM 01/30/2022    8:47 AM 02/08/2021    9:55 AM  Depression screen PHQ 2/9  Decreased Interest 0 0 0 0 0  Down, Depressed, Hopeless 0 0 0 0 0  PHQ - 2 Score 0 0 0 0 0  Altered sleeping 0 1 0 0 1  Tired, decreased energy 0 0 0 0 0  Change in appetite 0 0 0 0 0  Feeling bad or failure about yourself  0 0 0 0 0  Trouble concentrating 0 0 0 0 0  Moving slowly or fidgety/restless 0 0 0 0 0  Suicidal thoughts 0 0 0 0 0  PHQ-9 Score 0 1  0  0  1   Difficult doing work/chores Not difficult at all Not difficult at all Not difficult at all Not difficult at all Not difficult at all     Data saved with a previous flowsheet row definition   Past Medical History:  Past Medical History:  Diagnosis Date   Abnormal thyroid  function test    Actinic keratosis    Atypical mole 10/13/2017   Left mid tricep    Atypical mole 01/01/2010   right sup lat calf    Atypical mole 01/01/2010   left post lat thigh above popliteal    Atypical mole 05/09/2009   right scapula    Atypical  mole 01/03/2009   xyphoid    Chronic neck pain    Chronic renal failure    worsened with NSAIDs   Disorder of bursae and tendons in shoulder region    Fibroid    59yo   Gastritis    GERD (gastroesophageal reflux disease)    Hyperreflexia    Pelvic pain syndrome    Pudendal neuralgia    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   Uterine fibroid     Surgical History:  Past Surgical History:  Procedure Laterality Date   APPENDECTOMY     COLONOSCOPY WITH PROPOFOL  N/A 01/22/2015   Procedure: COLONOSCOPY WITH PROPOFOL ;  Surgeon: Gladis RAYMOND Mariner, MD;  Location: [facility] ENDOSCOPY;  Service: Endoscopy;  Laterality: N/A;   ESOPHAGOGASTRODUODENOSCOPY (EGD) WITH PROPOFOL  N/A 01/22/2015   Procedure: ESOPHAGOGASTRODUODENOSCOPY (EGD) WITH PROPOFOL ;  Surgeon: Gladis RAYMOND Mariner, MD;  Location: [facility] ENDOSCOPY;  Service: Endoscopy;  Laterality: N/A;   NISSEN FUNDOPLICATION  02/24/2001   TOTAL  ABDOMINAL HYSTERECTOMY  02/24/2010   hemorrhaging   UTERINE FIBROID SURGERY  02/24/1981    Medications:  Current Outpatient Medications on File Prior to Visit  Medication Sig   Cholecalciferol (VITAMIN D3) 2000 UNITS capsule Take 2,000 Units by mouth daily.   Magnesium 250 MG TABS Take 250 mg by mouth daily.   Multiple Vitamin (MULTI-DAY) TABS Take 1 tablet by mouth daily.   No current facility-administered medications on file prior to visit.    Allergies:  Allergies[1]  Social History:  Social History   Socioeconomic History   Marital status: Married    Spouse name: Not on file   Number of children: Not on file   Years of education: Not on file   Highest education level: Bachelor's degree (e.g., BA, AB, BS)  Occupational History   Not on file  Tobacco Use   Smoking status: Never   Smokeless tobacco: Never  Vaping Use   Vaping status: Never Used  Substance and Sexual Activity   Alcohol use: Yes    Alcohol/week: 1.0 standard drink of alcohol    Types: 1 Glasses of wine per week   Drug use: Never    Sexual activity: Not Currently    Birth control/protection: Surgical  Other Topics Concern   Not on file  Social History Narrative   Not on file   Social Drivers of Health   Tobacco Use: Low Risk (03/23/2024)   Patient History    Smoking Tobacco Use: Never    Smokeless Tobacco Use: Never    Passive Exposure: Not on file  Financial Resource Strain: Low Risk (03/22/2024)   Overall Financial Resource Strain (CARDIA)    Difficulty of Paying Living Expenses: Not hard at all  Food Insecurity: No Food Insecurity (03/22/2024)   Epic    Worried About Radiation Protection Practitioner of Food in the Last Year: Never true    Ran Out of Food in the Last Year: Never true  Transportation Needs: No Transportation Needs (03/22/2024)   Epic    Lack of Transportation (Medical): No    Lack of Transportation (Non-Medical): No  Physical Activity: Unknown (03/22/2024)   Exercise Vital Sign    Days of Exercise per Week: Patient declined    Minutes of Exercise per Session: Not on file  Stress: Patient Declined (03/23/2024)   [employer] of Occupational Health - Occupational Stress Questionnaire    Feeling of Stress: Patient declined  Social Connections: Unknown (03/22/2024)   Social Connection and Isolation Panel    Frequency of Communication with Friends and Family: Patient declined    Frequency of Social Gatherings with Friends and Family: Patient declined    Attends Religious Services: Patient declined    Active Member of Clubs or Organizations: Yes    Attends Banker Meetings: Patient declined    Marital Status: Married  Catering Manager Violence: Not At Risk (03/23/2024)   Epic    Fear of Current or Ex-Partner: No    Emotionally Abused: No    Physically Abused: No    Sexually Abused: No  Depression (PHQ2-9): Low Risk (03/23/2024)   Depression (PHQ2-9)    PHQ-2 Score: 0  Alcohol Screen: Low Risk (03/22/2024)   Alcohol Screen    Last Alcohol Screening Score (AUDIT): 2  Housing: Low Risk  (03/22/2024)   Epic    Unable to Pay for Housing in the Last Year: No    Number of Times Moved in the Last Year: 0    Homeless in the Last Year:  No  Utilities: Not At Risk (03/23/2024)   Epic    Threatened with loss of utilities: No  Health Literacy: Adequate Health Literacy (03/23/2024)   B1300 Health Literacy    Frequency of need for help with medical instructions: Never   Tobacco Use History[2] Social History   Substance and Sexual Activity  Alcohol Use Yes   Alcohol/week: 1.0 standard drink of alcohol   Types: 1 Glasses of wine per week    Family History:  Family History  Problem Relation Age of Onset   Arthritis Mother        rheumatoid   Depression Mother    Lymphoma Mother    Hypertension Sister    Thyroid  disease Brother    Bladder Cancer Brother    Breast cancer Neg Hx     Past medical history, surgical history, medications, allergies, family history and social history reviewed with patient today and changes made to appropriate areas of the chart.   Review of Systems  Constitutional: Negative.   HENT: Negative.    Eyes: Negative.   Respiratory: Negative.    Cardiovascular: Negative.   Gastrointestinal: Negative.   Genitourinary: Negative.   Musculoskeletal:  Positive for joint pain. Negative for back pain, falls, myalgias and neck pain.  Skin: Negative.   Neurological: Negative.   Endo/Heme/Allergies: Negative.   Psychiatric/Behavioral: Negative.     All other ROS negative except what is listed above and in the HPI.      Objective:    BP 122/84   Pulse 65   Temp 98 F (36.7 C) (Oral)   Ht 5' 3.5 (1.613 m)   Wt 150 lb (68 kg)   LMP 07/26/2010   SpO2 98%   BMI 26.15 kg/m   Wt Readings from Last 3 Encounters:  03/23/24 150 lb (68 kg)  03/19/23 147 lb 12.8 oz (67 kg)  03/17/22 144 lb 4.8 oz (65.5 kg)    Physical Exam Vitals and nursing note reviewed.  Constitutional:      General: She is not in acute distress.    Appearance: Normal  appearance. She is normal weight. She is not ill-appearing, toxic-appearing or diaphoretic.  HENT:     Head: Normocephalic and atraumatic.     Right Ear: Tympanic membrane, ear canal and external ear normal. There is no impacted cerumen.     Left Ear: Tympanic membrane, ear canal and external ear normal. There is no impacted cerumen.     Nose: Nose normal. No congestion or rhinorrhea.     Mouth/Throat:     Mouth: Mucous membranes are moist.     Pharynx: Oropharynx is clear. No oropharyngeal exudate or posterior oropharyngeal erythema.  Eyes:     General: No scleral icterus.       Right eye: No discharge.        Left eye: No discharge.     Extraocular Movements: Extraocular movements intact.     Conjunctiva/sclera: Conjunctivae normal.     Pupils: Pupils are equal, round, and reactive to light.  Neck:     Vascular: No carotid bruit.  Cardiovascular:     Rate and Rhythm: Normal rate and regular rhythm.     Pulses: Normal pulses.     Heart sounds: No murmur heard.    No friction rub. No gallop.  Pulmonary:     Effort: Pulmonary effort is normal. No respiratory distress.     Breath sounds: Normal breath sounds. No stridor. No wheezing, rhonchi or rales.  Chest:  Chest wall: No tenderness.  Abdominal:     General: Abdomen is flat. Bowel sounds are normal. There is no distension.     Palpations: Abdomen is soft. There is no mass.     Tenderness: There is no abdominal tenderness. There is no right CVA tenderness, left CVA tenderness, guarding or rebound.     Hernia: No hernia is present.  Genitourinary:    Comments: Breast and pelvic exams deferred with shared decision making Musculoskeletal:        General: No swelling, tenderness, deformity or signs of injury.     Cervical back: Normal range of motion and neck supple. No rigidity. No muscular tenderness.     Right lower leg: No edema.     Left lower leg: No edema.  Lymphadenopathy:     Cervical: No cervical adenopathy.   Skin:    General: Skin is warm and dry.     Capillary Refill: Capillary refill takes less than 2 seconds.     Coloration: Skin is not jaundiced or pale.     Findings: No bruising, erythema, lesion or rash.  Neurological:     General: No focal deficit present.     Mental Status: She is alert and oriented to person, place, and time. Mental status is at baseline.     Cranial Nerves: No cranial nerve deficit.     Sensory: No sensory deficit.     Motor: No weakness.     Coordination: Coordination normal.     Gait: Gait normal.     Deep Tendon Reflexes: Reflexes normal.  Psychiatric:        Mood and Affect: Mood normal.        Behavior: Behavior normal.        Thought Content: Thought content normal.        Judgment: Judgment normal.     Results for orders placed or performed in visit on 04/02/23  HM MAMMOGRAPHY   Collection Time: 04/01/23  7:47 AM  Result Value Ref Range   HM Mammogram 0-4 Bi-Rad 0-4 Bi-Rad, Self Reported Normal      Assessment & Plan:   Problem List Items Addressed This Visit   None Visit Diagnoses       Routine general medical examination at a health care facility    -  Primary   Vaccines updated. Screening labs checked today. Pap N/A. Mammo ordered. Colonoscopy up to date. Continue diet and exercise. Call with any concerns.   Relevant Orders   CBC with Differential/Platelet   Comprehensive metabolic panel with GFR   Lipid Panel w/o Chol/HDL Ratio   TSH     Chronic pain of right knee       Will get her over to see sports med. Was not happy with ortho. Await their input.   Relevant Orders   Ambulatory referral to Sports Medicine     Needs flu shot       Flu shot given today.   Relevant Orders   Flu vaccine trivalent PF, 6mos and older(Flulaval,Afluria,Fluarix,Fluzone) (Completed)     Need for pneumococcal 20-valent conjugate vaccination       Prevnar given today.   Relevant Orders   Pneumococcal conjugate vaccine 20-valent (Prevnar 20) (Completed)      Encounter for screening mammogram for malignant neoplasm of breast       Mammogram ordered today.   Relevant Orders   MM 3D SCREENING MAMMOGRAM BILATERAL BREAST        Follow up plan: Return  in about 1 year (around 03/23/2025) for physical.   LABORATORY TESTING:  - Pap smear: not applicable  IMMUNIZATIONS:   - Tdap: Tetanus vaccination status reviewed: last tetanus booster within 10 years. - Influenza: Administered today - Prevnar: Administered today - COVID: Refused - HPV: Not applicable - Shingrix  vaccine: Up to date  SCREENING: -Mammogram: Ordered today  - Colonoscopy: Up to date   PATIENT COUNSELING:   Advised to take 1 mg of folate supplement per day if capable of pregnancy.   Sexuality: Discussed sexually transmitted diseases, partner selection, use of condoms, avoidance of unintended pregnancy  and contraceptive alternatives.   Advised to avoid cigarette smoking.  I discussed with the patient that most people either abstain from alcohol or drink within safe limits (<=14/week and <=4 drinks/occasion for males, <=7/weeks and <= 3 drinks/occasion for females) and that the risk for alcohol disorders and other health effects rises proportionally with the number of drinks per week and how often a drinker exceeds daily limits.  Discussed cessation/primary prevention of drug use and availability of treatment for abuse.   Diet: Encouraged to adjust caloric intake to maintain  or achieve ideal body weight, to reduce intake of dietary saturated fat and total fat, to limit sodium intake by avoiding high sodium foods and not adding table salt, and to maintain adequate dietary potassium and calcium preferably from fresh fruits, vegetables, and low-fat dairy products.    stressed the importance of regular exercise  Injury prevention: Discussed safety belts, safety helmets, smoke detector, smoking near bedding or upholstery.   Dental health: Discussed importance of regular  tooth brushing, flossing, and dental visits.    NEXT PREVENTATIVE PHYSICAL DUE IN 1 YEAR. Return in about 1 year (around 03/23/2025) for physical.              [1]  Allergies Allergen Reactions   Amoxicillin    Diclofenac     Mobic [Meloxicam]   [2]  Social History Tobacco Use  Smoking Status Never  Smokeless Tobacco Never   "

## 2024-03-24 ENCOUNTER — Ambulatory Visit: Payer: Self-pay

## 2024-03-24 ENCOUNTER — Encounter: Payer: Self-pay | Admitting: Family Medicine

## 2024-03-24 ENCOUNTER — Ambulatory Visit (INDEPENDENT_AMBULATORY_CARE_PROVIDER_SITE_OTHER): Admitting: Family Medicine

## 2024-03-24 VITALS — BP 102/60 | HR 90 | Ht 63.5 in | Wt 152.0 lb

## 2024-03-24 DIAGNOSIS — M7051 Other bursitis of knee, right knee: Secondary | ICD-10-CM | POA: Insufficient documentation

## 2024-03-24 DIAGNOSIS — M1611 Unilateral primary osteoarthritis, right hip: Secondary | ICD-10-CM | POA: Diagnosis not present

## 2024-03-24 DIAGNOSIS — M2241 Chondromalacia patellae, right knee: Secondary | ICD-10-CM | POA: Insufficient documentation

## 2024-03-24 LAB — LIPID PANEL W/O CHOL/HDL RATIO
Cholesterol, Total: 259 mg/dL — ABNORMAL HIGH (ref 100–199)
HDL: 76 mg/dL
LDL Chol Calc (NIH): 171 mg/dL — ABNORMAL HIGH (ref 0–99)
Triglycerides: 75 mg/dL (ref 0–149)
VLDL Cholesterol Cal: 12 mg/dL (ref 5–40)

## 2024-03-24 LAB — COMPREHENSIVE METABOLIC PANEL WITH GFR
ALT: 16 [IU]/L (ref 0–32)
AST: 28 [IU]/L (ref 0–40)
Albumin: 4.5 g/dL (ref 3.8–4.9)
Alkaline Phosphatase: 84 [IU]/L (ref 49–135)
BUN/Creatinine Ratio: 15 (ref 12–28)
BUN: 17 mg/dL (ref 8–27)
Bilirubin Total: 0.5 mg/dL (ref 0.0–1.2)
CO2: 26 mmol/L (ref 20–29)
Calcium: 9.5 mg/dL (ref 8.7–10.3)
Chloride: 103 mmol/L (ref 96–106)
Creatinine, Ser: 1.14 mg/dL — ABNORMAL HIGH (ref 0.57–1.00)
Globulin, Total: 1.8 g/dL (ref 1.5–4.5)
Glucose: 87 mg/dL (ref 70–99)
Potassium: 4.3 mmol/L (ref 3.5–5.2)
Sodium: 140 mmol/L (ref 134–144)
Total Protein: 6.3 g/dL (ref 6.0–8.5)
eGFR: 55 mL/min/{1.73_m2} — ABNORMAL LOW

## 2024-03-24 LAB — CBC WITH DIFFERENTIAL/PLATELET
Basophils Absolute: 0.1 10*3/uL (ref 0.0–0.2)
Basos: 1 %
EOS (ABSOLUTE): 0.1 10*3/uL (ref 0.0–0.4)
Eos: 1 %
Hematocrit: 42.8 % (ref 34.0–46.6)
Hemoglobin: 13.8 g/dL (ref 11.1–15.9)
Immature Grans (Abs): 0 10*3/uL (ref 0.0–0.1)
Immature Granulocytes: 0 %
Lymphocytes Absolute: 4.9 10*3/uL — ABNORMAL HIGH (ref 0.7–3.1)
Lymphs: 60 %
MCH: 30.1 pg (ref 26.6–33.0)
MCHC: 32.2 g/dL (ref 31.5–35.7)
MCV: 93 fL (ref 79–97)
Monocytes Absolute: 0.4 10*3/uL (ref 0.1–0.9)
Monocytes: 5 %
Neutrophils Absolute: 2.7 10*3/uL (ref 1.4–7.0)
Neutrophils: 33 %
Platelets: 242 10*3/uL (ref 150–450)
RBC: 4.58 x10E6/uL (ref 3.77–5.28)
RDW: 12.2 % (ref 11.7–15.4)
WBC: 8.1 10*3/uL (ref 3.4–10.8)

## 2024-03-24 LAB — TSH: TSH: 6.92 u[IU]/mL — ABNORMAL HIGH (ref 0.450–4.500)

## 2024-03-24 NOTE — Patient Instructions (Signed)
 You have long-standing knee pain related to the kneecap, which is being affected by arthritis in the same-side hip. Our main goal right now is to reduce pain and inflammation so that you can safely and effectively return to physical therapy.  Treatment Plan  - Topical anti-inflammatory medication: Apply Voltaren  (diclofenac ) gel to the painful areas 1-4 times per day until your follow-up visit.  - Stomach protection: While using Voltaren , take pantoprazole  40 mg daily (two 20-mg tablets) to help protect your stomach.  - Activity modification: Limit lower-body activities (walking, stairs, squatting, exercise) based on pain. Avoid pushing through significant discomfort.  Next Steps  - Follow-up visit in 2 weeks.  - If pain remains limiting, we will have a low threshold to proceed with corticosteroid (cortisone) injections in the right hip and/or knee to better control symptoms.  - Once pain is adequately controlled, the plan is to restart physical therapy and gradually increase activity.  Additional Discussion  We discussed that when pain persists for a long time, it can sometimes become more difficult to control due to changes in how the nervous system processes pain (central sensitization). This is why early symptom control is important to support recovery and rehabilitation.  Please contact us  sooner if pain worsens significantly or if you have any concerns about the medications.

## 2024-03-24 NOTE — Assessment & Plan Note (Signed)
 SABRA

## 2024-03-24 NOTE — Progress Notes (Signed)
 "    Primary Care / Sports Medicine Office Visit  Patient Information:  Patient ID: Sabrina Baxter, female DOB: 01/15/1966 Age: 59 y.o. MRN: [id number]   Marc Sivertsen is a pleasant 59 y.o. female presenting with the following:  Chief Complaint  Patient presents with   Knee Pain    Right knee pain x 2 years. Pain is on the lateral aspect and comes across to medial side. Patient has MRI repost from last year. Needs second opinion. Pain is positional also.   Hip Pain    Right hip pain (chronic). Patient had PT for hip pain which helped. Pain is stabbing. Second opinion.    Vitals:   03/24/24 1421  BP: 102/60  Pulse: 90  SpO2: 98%   Vitals:   03/24/24 1421  Weight: 152 lb (68.9 kg)  Height: 5' 3.5 (1.613 m)   Body mass index is 26.5 kg/m.  No results found.   Independent interpretation of notes and tests performed by another provider:   None  Procedures performed:   None  Pertinent History, Exam, Impression, and Recommendations:   Discussed the use of AI scribe software for clinical note transcription with the patient, who gave verbal consent to proceed.  History of Present Illness    Assessment and Plan Assessment & Plan  Longstanding pain (years) with atraumatic onset to right hip and knee. Hip pain localized to anterior groin and has been present longer. Knee symptoms localized inferior to the right patella, non-radiating. Has had knee MRI evaluation at outside orthopedic group with posterior horn lateral meniscus derangement. Relative NSAID intolerance related to GI issues.  RIGHT KNEE INSPECTION: Normal alignment without erythema or ecchymosis. No suprapatellar fullness noted. PALPATION: Mild tenderness at the medial patellar facet. Tenderness at the pes anserine region. Patellar tendon distally minimally tender, quadriceps tendon non-tender. Hoffa's fat pad equivocal tenderness. Medial and lateral joint lines non-tender. No warmth. RANGE OF MOTION: 0-120  degrees with discomfort at terminal flexion. No mechanical obstruction. STRENGTH: Quadriceps strength 5/5. Extensor mechanism intact. NEUROLOGICAL: Sensation intact to light touch in the right lower extremity; no focal motor deficit. SPECIAL TESTS: Patellofemoral loading with single leg squat reproduces anterior knee pain. Ligaments stable to varus and valgus stress. Anterior and posterior drawer tests negative.   RIGHT HIP INSPECTION: Normal pelvic alignment without deformity. PALPATION: Mild groin and anterior hip tenderness. Greater trochanter non-tender. RANGE OF MOTION: Hip flexion and extension preserved. Internal rotation limited with pain. External rotation mildly limited. STRENGTH: Hip flexion, extension, abduction, and adduction 5/5 with mild pain on resisted flexion. NEUROLOGICAL: Sensation intact to light touch in the right lower extremity. SPECIAL TESTS: FADIR test positive on the right. FABER test reproduces anterior hip discomfort. Straight leg raise negative.  A/P Longstanding patellofemoral pain in the setting of ipsilateral hip osteoarthritis. We discussed goal of treatment involving symptom reduction to facilitate appropriate physical therapy. Would benefit from optimization of symptom control, will trial topical NSAID regimen with low threshold to advance to intra-articular corticosteroid injection(s) at right hip and knee. We discussed chronic pain and the possibility of central sensitization if symptoms recalcitrant to inflammation control options. - Trial Voltaren  gel 1-4 times / day until return - Use higher 40 mg (2 tabs) dose of pantoprazole  while using Voltaren  - Limit lower body activity as symptoms dictate - Return in 2 weeks for follow-up with low threshold to utilize cortisone injection(s) - Once symptoms adequately controlled, plan to restart physical therapy and advance activity  Assessment & Plan Primary osteoarthritis of  right hip     Chondromalacia of  right patella     Pes anserinus bursitis of right knee      Return in about 2 years (around 03/24/2026) for ORTHO PROCEDURE.     Selinda JINNY Ku, MD, [facility]   Primary Care Sports Medicine Primary Care and Sports Medicine at [facility]   "

## 2024-03-24 NOTE — Assessment & Plan Note (Signed)
 Sabrina Baxter

## 2024-03-24 NOTE — Telephone Encounter (Signed)
 FYI Only or Action Required?: FYI only for provider: appointment scheduled on 03/24/2024 at 2:40pm with Dr Selinda Ku at [facility].  Patient was last seen in primary care on 03/23/2024 by Vicci Duwaine SQUIBB, DO.  Called Nurse Triage reporting Knee Pain.  Symptoms began chronic pain.  Interventions attempted: Rest, hydration, or home remedies, Ice/heat application, and Other: physical therapy.  Symptoms are: gradually worsening.  Triage Disposition: See PCP When Office is Open (Within 3 Days)  Patient/caregiver understands and will follow disposition?: Yes      Message from [facility] E sent at 03/24/2024 10:23 AM EST  Reason for Triage: Worsening knee and hip pain, cannot walk a block, has been dealing with this for years and its now coming to a head.  Seeking ortho visit with Dr. Ku, called Primary Care at [facility].   Reason for Disposition  [1] MODERATE pain (e.g., interferes with normal activities, limping) AND [2] present > 3 days  Answer Assessment - Initial Assessment Questions Right Hip pain since 2021 Saw a physical therapist for exercises Started to develop right knee pain in 2025 At the beginning of 2025 she started feeling the knee pain after a walk Movement makes it worse Went to [facility], got an MRI done, nothing was done afterwards she states Sent to a physical therapist after that Then patient saw another physical therapist, Amy,  at [facility] --front outside of lower leg gets tired easier  Sitting still, patient has no pain When walking, patient states she has severe pain in her right hip and right knee  She states her right foot has been turned out for her whole life Right knee: No swelling, redness, bruising Right hip: No swelling, bruising  No known injuries  Patient denies difficulty breathing or chest pain  Patient was advised by her PCP to see Dr Ku for ortho--- This RN called [facility] at [facility] and patient was put on  the schedule today for a New Ortho appointment.  Patient is advised to call us  back if anything changes or with any further questions/concerns. Patient is advised that if anything worsens to go to the Emergency Room. Patient verbalized understanding.  Protocols used: Knee Pain-A-AH

## 2024-03-25 ENCOUNTER — Ambulatory Visit: Payer: Self-pay | Admitting: Family Medicine

## 2024-04-01 ENCOUNTER — Encounter: Payer: Self-pay | Admitting: Family Medicine

## 2024-04-01 ENCOUNTER — Ambulatory Visit: Admitting: Family Medicine

## 2024-04-01 VITALS — BP 133/85 | HR 72 | Temp 97.7°F | Resp 17 | Ht 63.5 in | Wt 153.6 lb

## 2024-04-01 DIAGNOSIS — E782 Mixed hyperlipidemia: Secondary | ICD-10-CM | POA: Insufficient documentation

## 2024-04-01 DIAGNOSIS — E039 Hypothyroidism, unspecified: Secondary | ICD-10-CM | POA: Insufficient documentation

## 2024-04-01 MED ORDER — LEVOTHYROXINE SODIUM 25 MCG PO TABS: 25.0000 ug | ORAL_TABLET | Freq: Every day | ORAL | 0 refills | Status: AC

## 2024-04-01 NOTE — Progress Notes (Signed)
 "  BP 133/85 (BP Location: Left Arm, Patient Position: Sitting, Cuff Size: Normal)   Pulse 72   Temp 97.7 F (36.5 C) (Oral)   Resp 17   Ht 5' 3.5 (1.613 m)   Wt 153 lb 9.6 oz (69.7 kg)   LMP 07/26/2010   SpO2 97%   BMI 26.78 kg/m    Subjective:    Patient ID: Sabrina Baxter, female    DOB: 03/21/1965, 59 y.o.   MRN: [id number]  HPI: Sabrina Baxter is a 59 y.o. female  Chief Complaint  Patient presents with   Hypothyroidism   HYPOTHYROIDISM Thyroid  control status:uncontrolled Satisfied with current treatment? Not on anything Medication side effects: N/A Fatigue: yes Cold intolerance: yes Heat intolerance: no Weight gain: yes Weight loss: no Constipation: no Diarrhea/loose stools: no Palpitations: no Lower extremity edema: no Anxiety/depressed mood: yes  Relevant past medical, surgical, family and social history reviewed and updated as indicated. Interim medical history since our last visit reviewed. Allergies and medications reviewed and updated.  Review of Systems  Constitutional: Negative.   Respiratory: Negative.    Cardiovascular: Negative.   Musculoskeletal: Negative.   Psychiatric/Behavioral: Negative.      Per HPI unless specifically indicated above     Objective:    BP 133/85 (BP Location: Left Arm, Patient Position: Sitting, Cuff Size: Normal)   Pulse 72   Temp 97.7 F (36.5 C) (Oral)   Resp 17   Ht 5' 3.5 (1.613 m)   Wt 153 lb 9.6 oz (69.7 kg)   LMP 07/26/2010   SpO2 97%   BMI 26.78 kg/m   Wt Readings from Last 3 Encounters:  04/01/24 153 lb 9.6 oz (69.7 kg)  03/24/24 152 lb (68.9 kg)  03/23/24 150 lb (68 kg)    Physical Exam Vitals and nursing note reviewed.  Constitutional:      General: She is not in acute distress.    Appearance: Normal appearance. She is not ill-appearing, toxic-appearing or diaphoretic.  HENT:     Head: Normocephalic and atraumatic.     Right Ear: External ear normal.     Left Ear: External ear normal.      Nose: Nose normal.     Mouth/Throat:     Mouth: Mucous membranes are moist.     Pharynx: Oropharynx is clear.  Eyes:     General: No scleral icterus.       Right eye: No discharge.        Left eye: No discharge.     Extraocular Movements: Extraocular movements intact.     Conjunctiva/sclera: Conjunctivae normal.     Pupils: Pupils are equal, round, and reactive to light.  Cardiovascular:     Rate and Rhythm: Normal rate and regular rhythm.     Pulses: Normal pulses.     Heart sounds: Normal heart sounds. No murmur heard.    No friction rub. No gallop.  Pulmonary:     Effort: Pulmonary effort is normal. No respiratory distress.     Breath sounds: Normal breath sounds. No stridor. No wheezing, rhonchi or rales.  Chest:     Chest wall: No tenderness.  Musculoskeletal:        General: Normal range of motion.     Cervical back: Normal range of motion and neck supple.  Skin:    General: Skin is warm and dry.     Capillary Refill: Capillary refill takes less than 2 seconds.     Coloration: Skin is not jaundiced or pale.  Findings: No bruising, erythema, lesion or rash.  Neurological:     General: No focal deficit present.     Mental Status: She is alert and oriented to person, place, and time. Mental status is at baseline.  Psychiatric:        Mood and Affect: Mood normal.        Behavior: Behavior normal.        Thought Content: Thought content normal.        Judgment: Judgment normal.     Results for orders placed or performed in visit on 03/23/24  CBC with Differential/Platelet   Collection Time: 03/23/24  9:16 AM  Result Value Ref Range   WBC 8.1 3.4 - 10.8 x10E3/uL   RBC 4.58 3.77 - 5.28 x10E6/uL   Hemoglobin 13.8 11.1 - 15.9 g/dL   Hematocrit 57.1 65.9 - 46.6 %   MCV 93 79 - 97 fL   MCH 30.1 26.6 - 33.0 pg   MCHC 32.2 31.5 - 35.7 g/dL   RDW 87.7 88.2 - 84.5 %   Platelets 242 150 - 450 x10E3/uL   Neutrophils 33 Not Estab. %   Lymphs 60 Not Estab. %   Monocytes  5 Not Estab. %   Eos 1 Not Estab. %   Basos 1 Not Estab. %   Neutrophils Absolute 2.7 1.4 - 7.0 x10E3/uL   Lymphocytes Absolute 4.9 (H) 0.7 - 3.1 x10E3/uL   Monocytes Absolute 0.4 0.1 - 0.9 x10E3/uL   EOS (ABSOLUTE) 0.1 0.0 - 0.4 x10E3/uL   Basophils Absolute 0.1 0.0 - 0.2 x10E3/uL   Immature Granulocytes 0 Not Estab. %   Immature Grans (Abs) 0.0 0.0 - 0.1 x10E3/uL  Comprehensive metabolic panel with GFR   Collection Time: 03/23/24  9:16 AM  Result Value Ref Range   Glucose 87 70 - 99 mg/dL   BUN 17 8 - 27 mg/dL   Creatinine, Ser 8.85 (H) 0.57 - 1.00 mg/dL   eGFR 55 (L) >40 fO/fpw/8.26   BUN/Creatinine Ratio 15 12 - 28   Sodium 140 134 - 144 mmol/L   Potassium 4.3 3.5 - 5.2 mmol/L   Chloride 103 96 - 106 mmol/L   CO2 26 20 - 29 mmol/L   Calcium 9.5 8.7 - 10.3 mg/dL   Total Protein 6.3 6.0 - 8.5 g/dL   Albumin 4.5 3.8 - 4.9 g/dL   Globulin, Total 1.8 1.5 - 4.5 g/dL   Bilirubin Total 0.5 0.0 - 1.2 mg/dL   Alkaline Phosphatase 84 49 - 135 IU/L   AST 28 0 - 40 IU/L   ALT 16 0 - 32 IU/L  Lipid Panel w/o Chol/HDL Ratio   Collection Time: 03/23/24  9:16 AM  Result Value Ref Range   Cholesterol, Total 259 (H) 100 - 199 mg/dL   Triglycerides 75 0 - 149 mg/dL   HDL 76 >60 mg/dL   VLDL Cholesterol Cal 12 5 - 40 mg/dL   LDL Chol Calc (NIH) 828 (H) 0 - 99 mg/dL  TSH   Collection Time: 03/23/24  9:16 AM  Result Value Ref Range   TSH 6.920 (H) 0.450 - 4.500 uIU/mL      Assessment & Plan:   Problem List Items Addressed This Visit       Endocrine   Acquired hypothyroidism - Primary   Will start her on levothyroxine  and recheck in 6 weeks. Call with any concerns.       Relevant Medications   levothyroxine  (SYNTHROID ) 25 MCG tablet   Other  Relevant Orders   TSH     Other   Mixed hyperlipidemia   ? Due to thyroid . Will treat thyroid  and recheck in about 6 weeks.       Relevant Orders   Lipid Panel w/o Chol/HDL Ratio     Follow up plan: Return in about 6 weeks (around  05/13/2024).      "

## 2024-04-01 NOTE — Assessment & Plan Note (Signed)
 Will start her on levothyroxine  and recheck in 6 weeks. Call with any concerns.

## 2024-04-01 NOTE — Assessment & Plan Note (Signed)
?   Due to thyroid . Will treat thyroid  and recheck in about 6 weeks.

## 2024-04-07 ENCOUNTER — Ambulatory Visit: Admitting: Family Medicine

## 2024-05-13 ENCOUNTER — Ambulatory Visit: Admitting: Family Medicine
